# Patient Record
Sex: Male | Born: 1955 | Race: White | Hispanic: No | Marital: Married | Smoking: Never smoker
Health system: Southern US, Community
[De-identification: ages and names within clinical notes are randomized; demographics above are authoritative.]

## PROBLEM LIST (undated history)

## (undated) DIAGNOSIS — D649 Anemia, unspecified: Secondary | ICD-10-CM

## (undated) DIAGNOSIS — I499 Cardiac arrhythmia, unspecified: Secondary | ICD-10-CM

## (undated) DIAGNOSIS — F32A Depression, unspecified: Secondary | ICD-10-CM

## (undated) DIAGNOSIS — N289 Disorder of kidney and ureter, unspecified: Secondary | ICD-10-CM

## (undated) DIAGNOSIS — R011 Cardiac murmur, unspecified: Secondary | ICD-10-CM

## (undated) DIAGNOSIS — R569 Unspecified convulsions: Secondary | ICD-10-CM

## (undated) DIAGNOSIS — F329 Major depressive disorder, single episode, unspecified: Secondary | ICD-10-CM

## (undated) DIAGNOSIS — M5126 Other intervertebral disc displacement, lumbar region: Secondary | ICD-10-CM

## (undated) HISTORY — DX: Cardiac murmur, unspecified: R01.1

## (undated) HISTORY — PX: CARDIAC CATHETERIZATION: SHX172

## (undated) HISTORY — DX: Major depressive disorder, single episode, unspecified: F32.9

## (undated) HISTORY — DX: Anemia, unspecified: D64.9

## (undated) HISTORY — DX: Unspecified convulsions: R56.9

## (undated) HISTORY — DX: Cardiac arrhythmia, unspecified: I49.9

## (undated) HISTORY — DX: Depression, unspecified: F32.A

---

## 2004-02-27 ENCOUNTER — Encounter: Admission: RE | Admit: 2004-02-27 | Discharge: 2004-02-27 | Payer: Self-pay | Admitting: Neurology

## 2004-02-27 IMAGING — CR DG CHEST 2V
2 series · 2 of 2 positions shown · non-contrast
Comparison: none

CLINICAL DATA: Unspecified peripheral neuropathy. 

TWO VIEW CHEST

[view not recorded (1 of 2)]
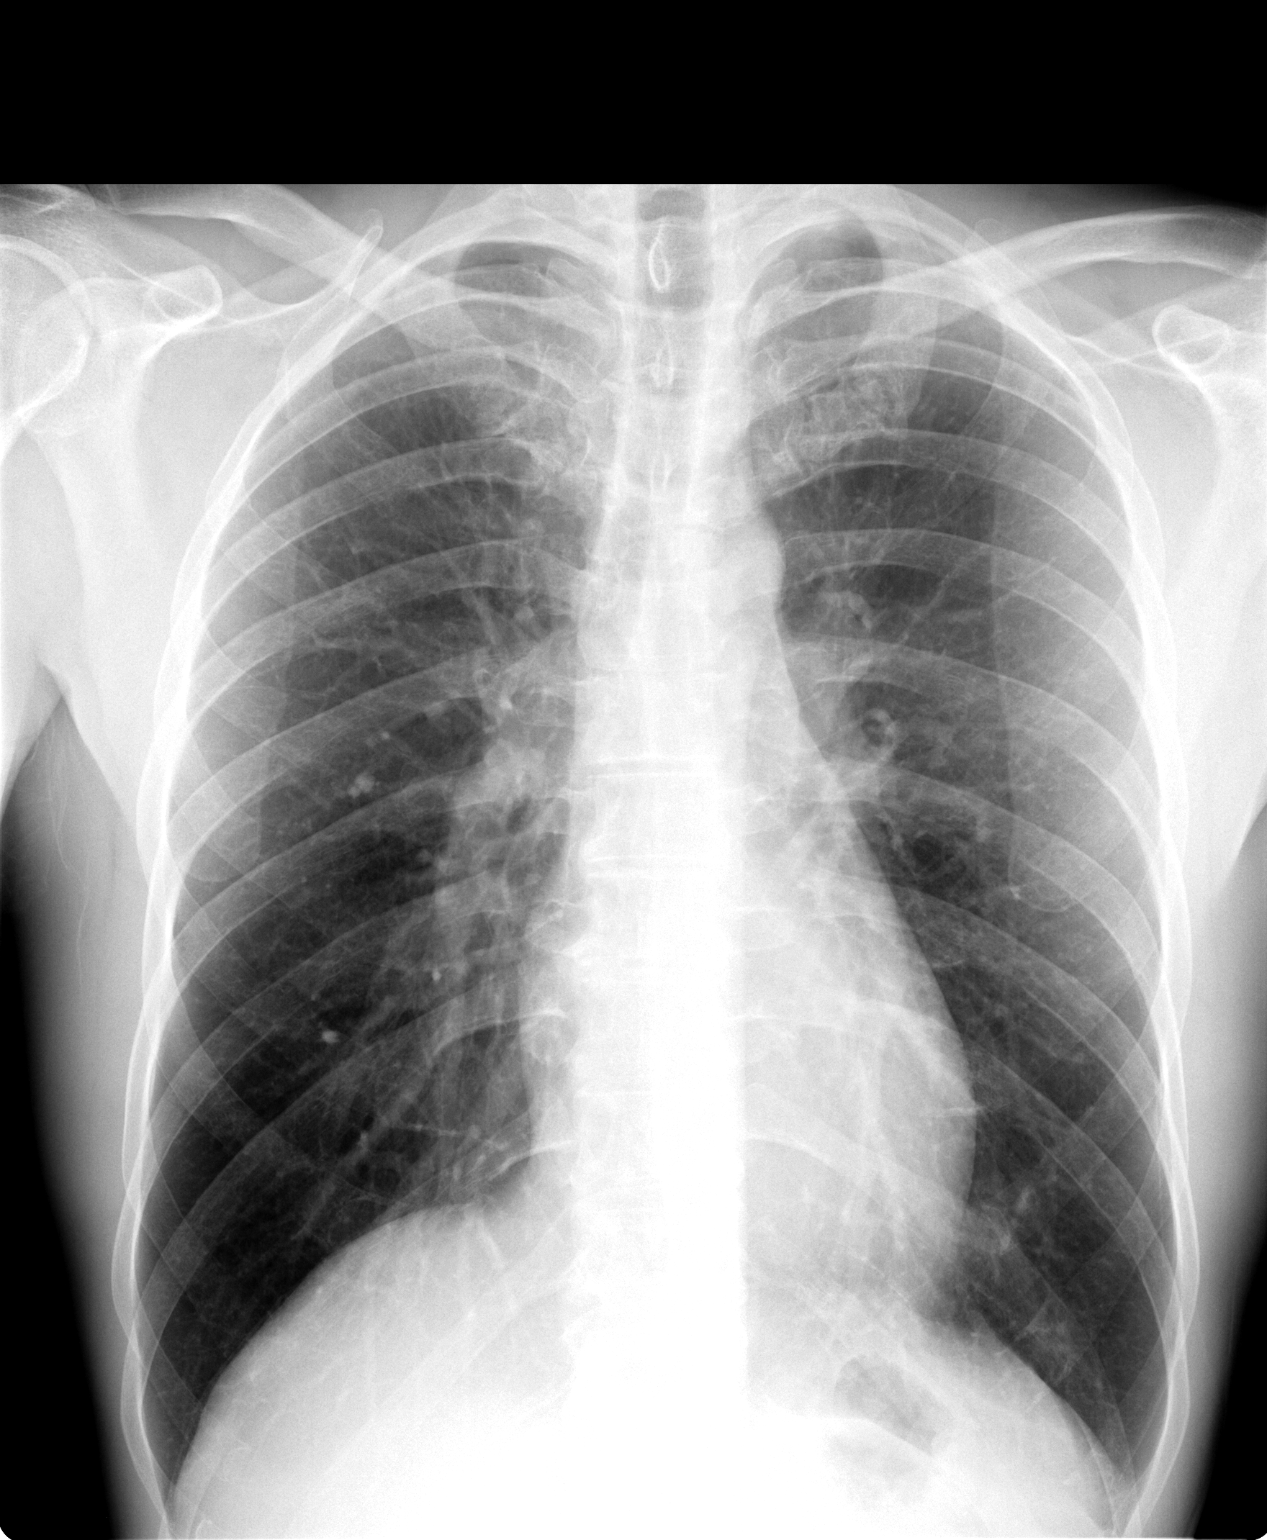

[view not recorded (2 of 2)]
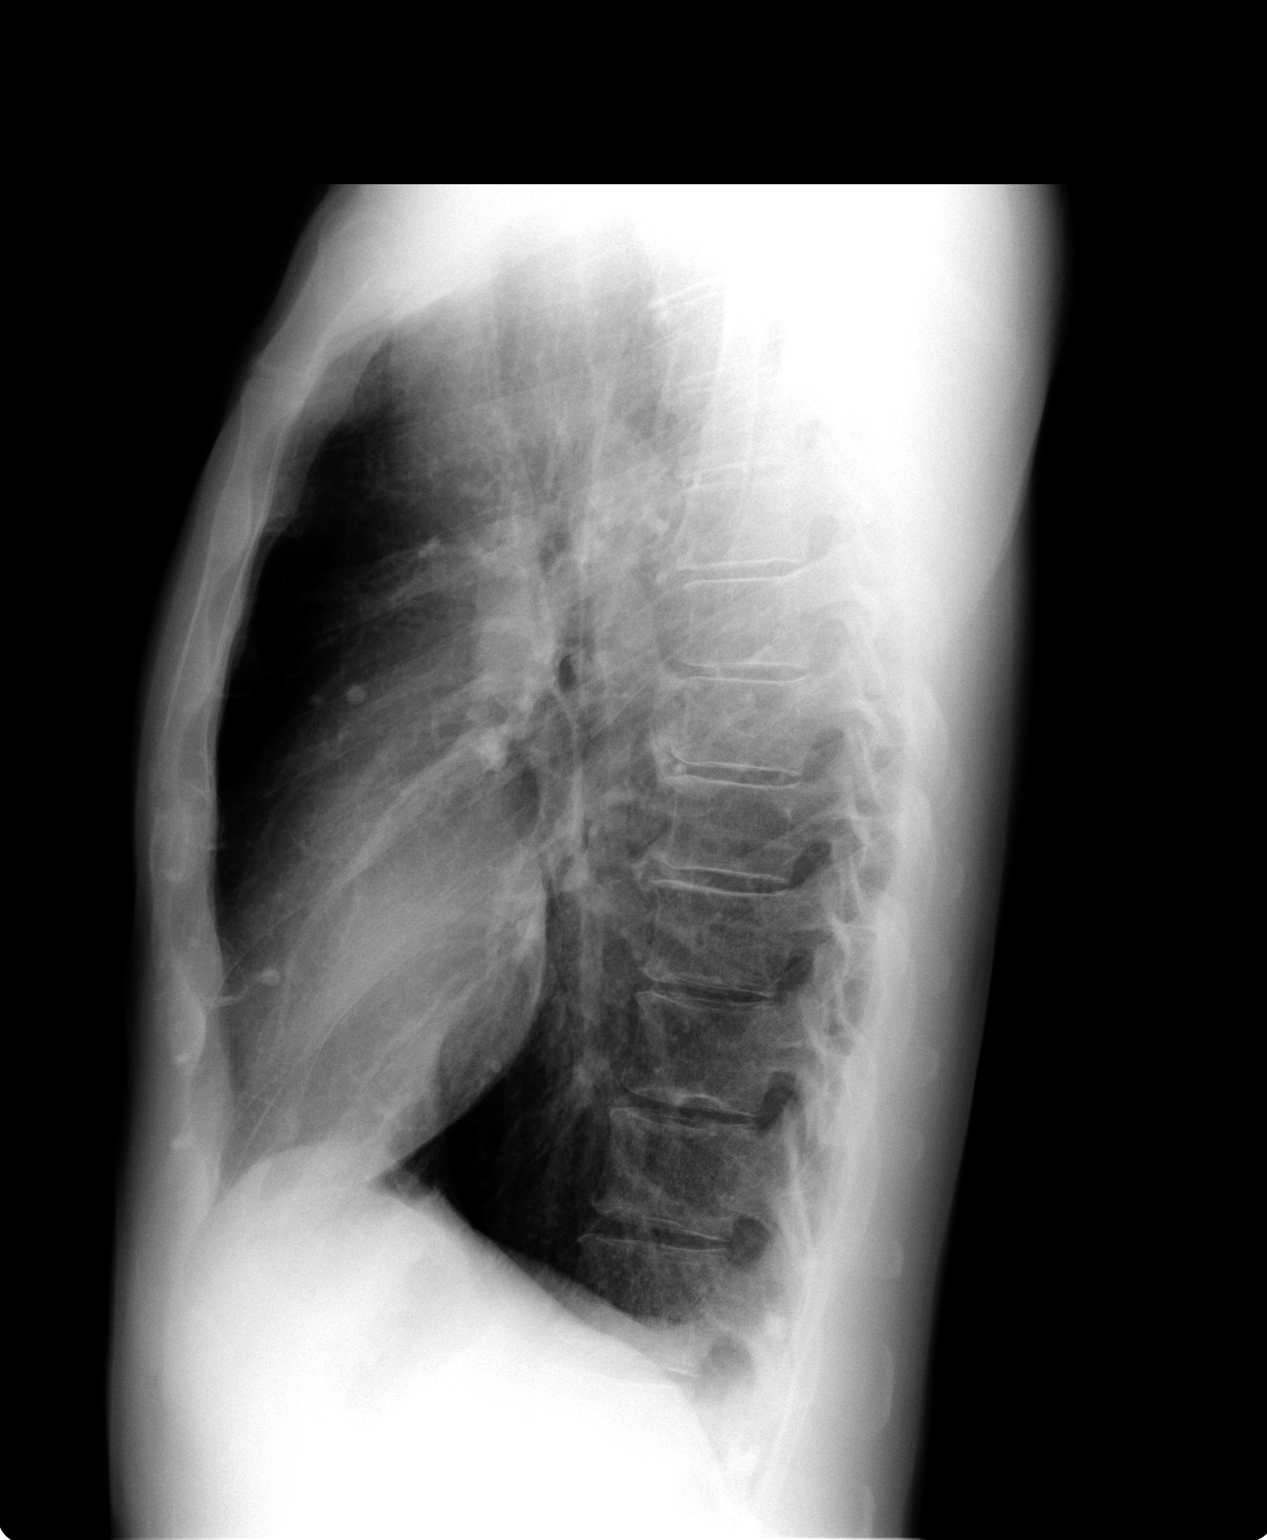

[2 of 2 positions shown; findings below may reference images not displayed]

FINDINGS: The cardiac silhouette, mediastinal and hilar contours are within normal limits. The lungs are
demonstrate hyperinflation. There are small  nodules in both lungs which are probably calcified
granulomas. However,  I would recommend correlation with any prior chest films to make sure these
are stable.  No infiltrates, effusions or edema. Bony structures are intact

IMPRESSION

1. No acute cardiopulmonary findings.
2. Probable small calcified granulomata. Correlation is suggested with any prior films if
available. Otherwise, I would recommend a follow-up two chest in three months to make sure these
are stable and unchanged.
3. Mild hyperinflation.

## 2004-07-07 LAB — HM COLONOSCOPY: HM Colonoscopy: NORMAL

## 2004-07-13 ENCOUNTER — Ambulatory Visit: Payer: Self-pay | Admitting: Internal Medicine

## 2004-08-04 ENCOUNTER — Encounter: Admission: RE | Admit: 2004-08-04 | Discharge: 2004-08-04 | Payer: Self-pay | Admitting: Internal Medicine

## 2004-08-04 IMAGING — CT CT ABDOMEN W/O CM
1 of 4 series · 11 of 32 positions shown, 17 images · non-contrast
Comparison: none

CLINICAL DATA: Mr. DIPALI is a 48-year-old male patient of Dr. DIPALI? here in [HOSPITAL], [facility]HOSPITAL].  Mr. DIPALI has no reported family history of colorectal cancer and no rectal bleeding or abdominal pain.  Virtual colonoscopy was performed today for colorectal cancer screening.
TECHNIQUE: tandard Fleet?s double dose phosphosoda bowel preparation was used for cleansing.  Patient was also given barium and Gastrografin for stool and fluid tagging respectively.  The quality of the colonic preparation is excellent.  The quality of the colonic distention is excellent.   
CT ABDOMEN AND PELVIS WITHOUT CONTRAST (VIRTUAL COLONOSCOPY):
VIRTUAL COLONOSCOPY:
No significant colonic polyps or masses are identified.  There are no significant areas of diverticular change.  No substantial muscular wall hypertrophy is apparent.

[Series 3: recon 2: supine · axial · 0.70mm/px · z∈[-424,-25]mm · 11 of 382 slices shown, 17 images]
[im 32/382  soft-tissue]
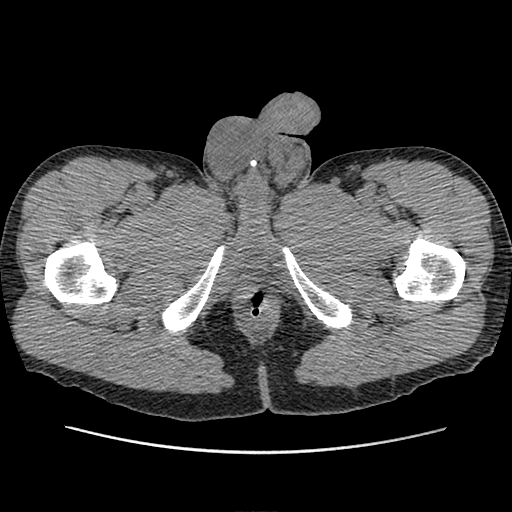
[im 32/382  bone]
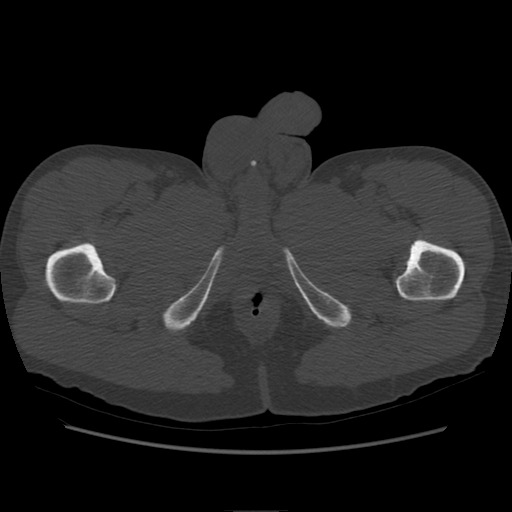
[im 64/382  soft-tissue]
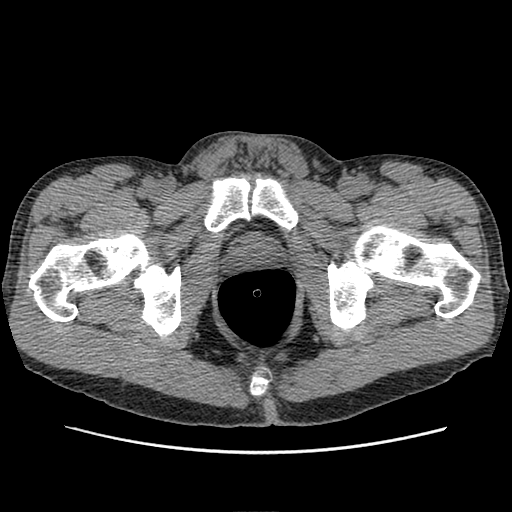
[im 96/382  soft-tissue]
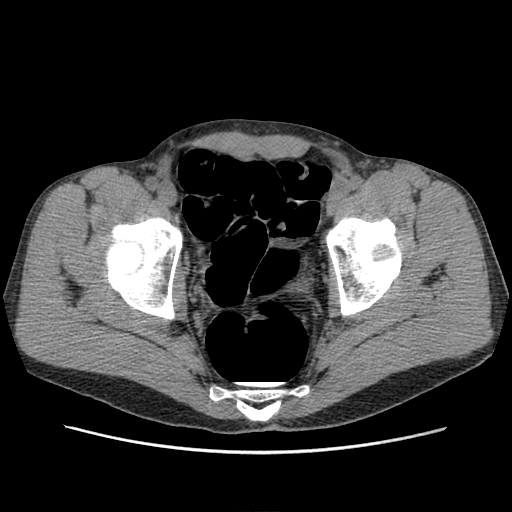
[im 128/382  soft-tissue]
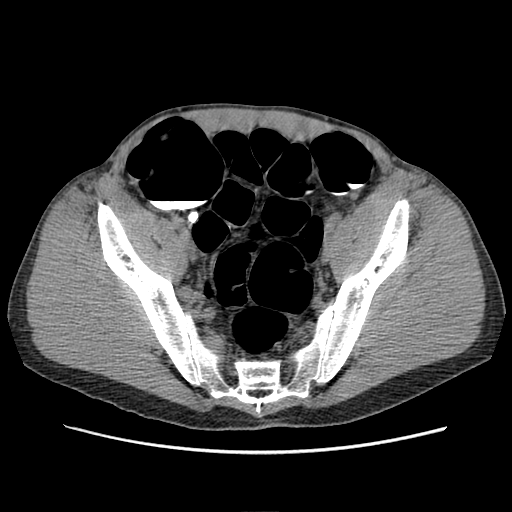
[im 159/382  soft-tissue]
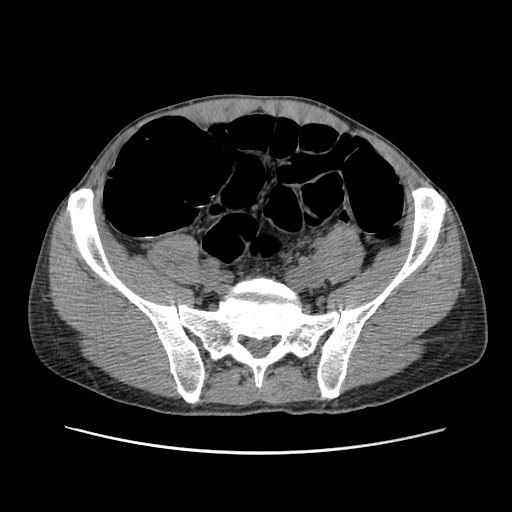
[im 191/382  soft-tissue]
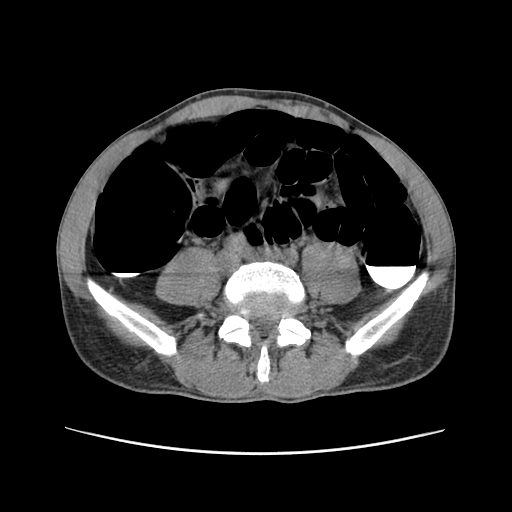
[im 223/382  soft-tissue]
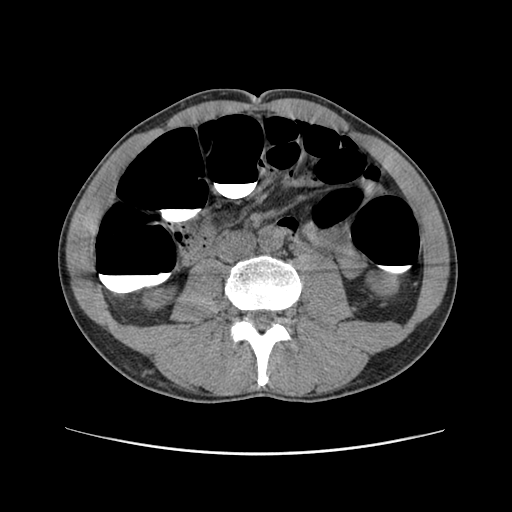
[im 255/382  soft-tissue]
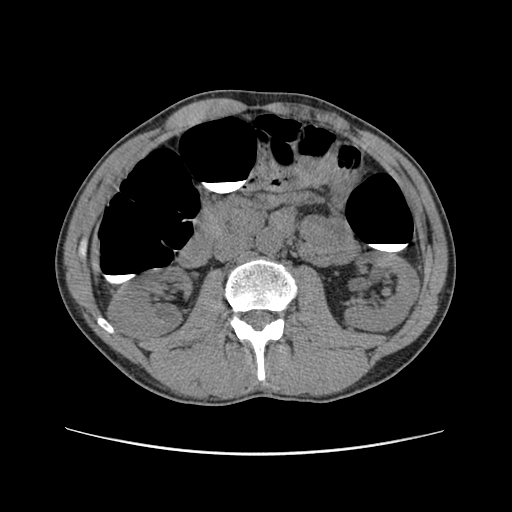
[im 255/382  lung]
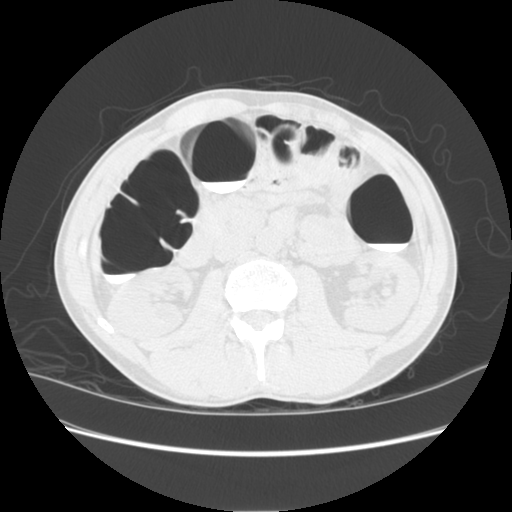
[im 286/382  soft-tissue]
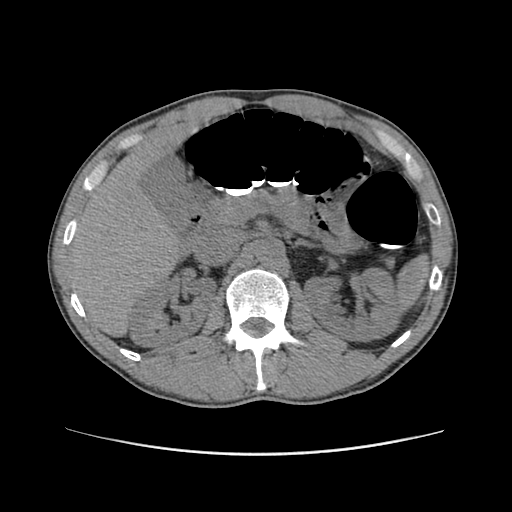
[im 286/382  lung]
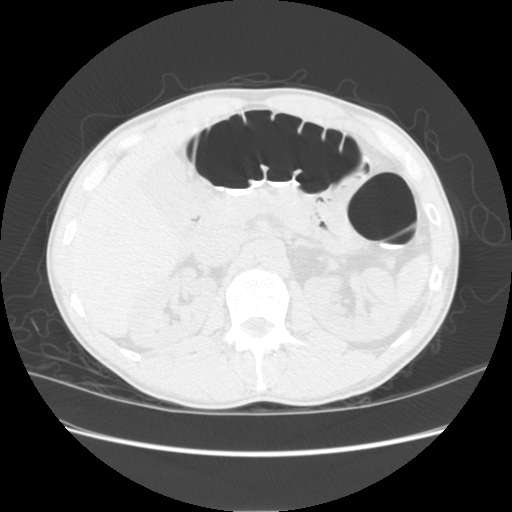
[im 286/382  bone]
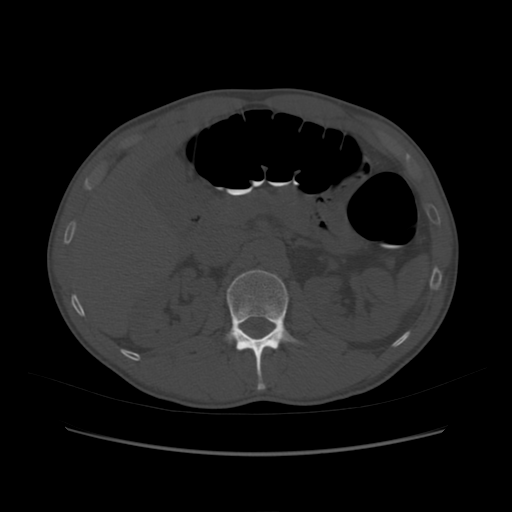
[im 318/382  soft-tissue]
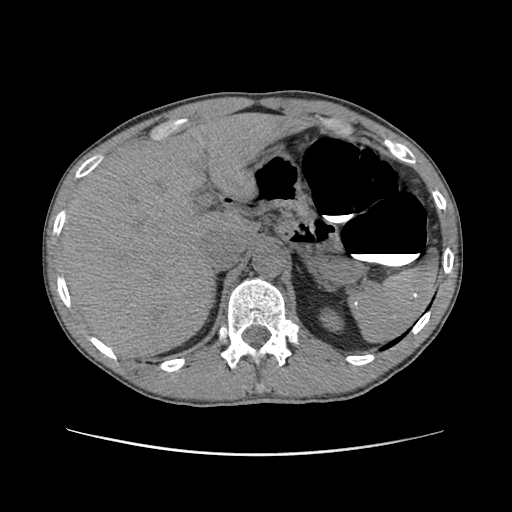
[im 318/382  lung]
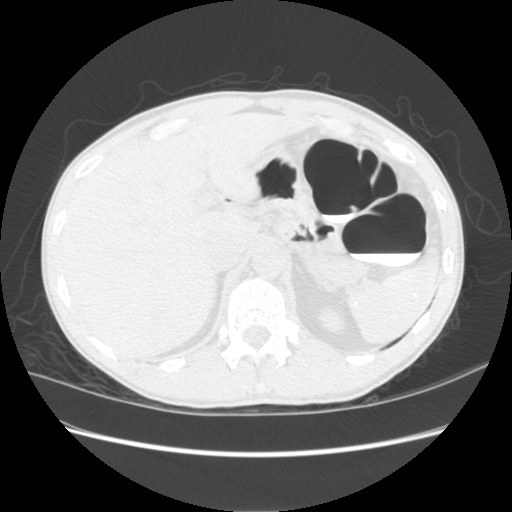
[im 350/382  soft-tissue]
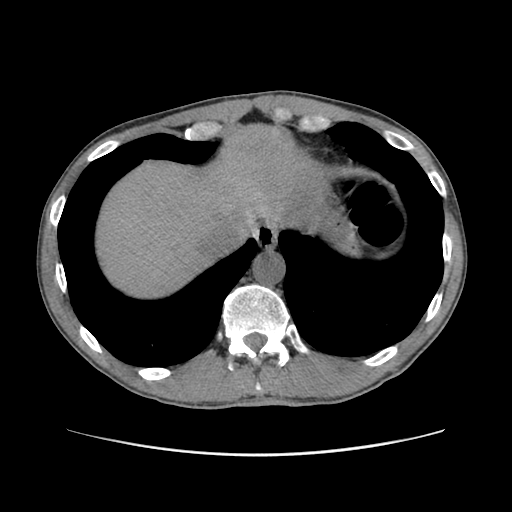
[im 350/382  lung]
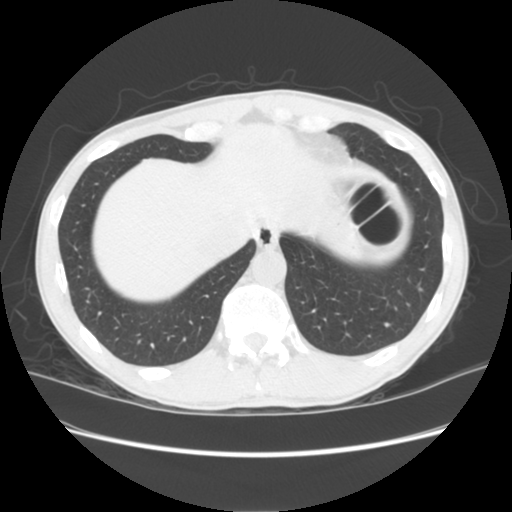

[11 of 32 positions shown; findings below may reference images not displayed]

IMPRESSION: 1.  No clinically significant colonic polyps or masses are identified. 
2.  Virtual colonoscopy is not designed to detect diminutive polyps (i.e., less than or equal to 5 mm), the presence or absence of which may not affect clinical management. 
ABDOMEN CT WITHOUT CONTRAST:
Incidental imaging of the lung bases demonstrates a calcified granuloma in the right lung base.  Calcified granulomatous changes noted in the spleen as well.  Liver and spleen have otherwise normal uninfused appearance.  The stomach, duodenum, pancreas, gallbladder, adrenal glands, and kidneys are unremarkable.  There is no free fluid or lymphadenopathy.  The abdominal aorta is normal without appreciable atherosclerotic calcification.
IMPRESSION: Granulomatous change in the right lung base and spleen.  The exam is otherwise a normal uninfused study.
PELVIS CT WITHOUT CONTRAST:
Imaging through the anatomic pelvis reveals no free fluid or adenopathy.   The prostate gland is not enlarged.  Imaged bony structures are unremarkable.
IMPRESSION: Normal uninfused CT exam of the pelvis.

## 2004-10-11 ENCOUNTER — Ambulatory Visit: Payer: Self-pay | Admitting: Internal Medicine

## 2004-10-26 ENCOUNTER — Ambulatory Visit: Payer: Self-pay | Admitting: Internal Medicine

## 2005-02-28 ENCOUNTER — Ambulatory Visit: Payer: Self-pay | Admitting: Internal Medicine

## 2005-03-02 ENCOUNTER — Ambulatory Visit: Payer: Self-pay

## 2007-02-15 ENCOUNTER — Telehealth: Payer: Self-pay | Admitting: Internal Medicine

## 2007-04-03 ENCOUNTER — Telehealth: Payer: Self-pay | Admitting: Internal Medicine

## 2007-04-04 ENCOUNTER — Ambulatory Visit: Payer: Self-pay | Admitting: Internal Medicine

## 2008-08-25 ENCOUNTER — Encounter: Payer: Self-pay | Admitting: Internal Medicine

## 2008-08-25 LAB — CONVERTED CEMR LAB: PSA: 0.48 ng/mL

## 2008-09-03 ENCOUNTER — Telehealth: Payer: Self-pay | Admitting: Internal Medicine

## 2008-11-13 ENCOUNTER — Telehealth: Payer: Self-pay | Admitting: Internal Medicine

## 2008-11-26 ENCOUNTER — Ambulatory Visit: Payer: Self-pay | Admitting: Internal Medicine

## 2010-11-11 ENCOUNTER — Encounter: Payer: Self-pay | Admitting: Internal Medicine

## 2010-11-15 ENCOUNTER — Other Ambulatory Visit: Payer: Self-pay | Admitting: *Deleted

## 2010-11-15 ENCOUNTER — Ambulatory Visit (INDEPENDENT_AMBULATORY_CARE_PROVIDER_SITE_OTHER): Payer: Commercial Managed Care - PPO | Admitting: Internal Medicine

## 2010-11-15 ENCOUNTER — Encounter: Payer: Self-pay | Admitting: Internal Medicine

## 2010-11-15 VITALS — BP 126/72 | HR 64 | Temp 98.1°F | Ht 73.0 in | Wt 164.0 lb

## 2010-11-15 DIAGNOSIS — Z0000 Encounter for general adult medical examination without abnormal findings: Secondary | ICD-10-CM

## 2010-11-15 DIAGNOSIS — G4700 Insomnia, unspecified: Secondary | ICD-10-CM

## 2010-11-15 LAB — CBC WITH DIFFERENTIAL/PLATELET
Basophils Absolute: 0 10*3/uL (ref 0.0–0.1)
Basophils Relative: 0.7 % (ref 0.0–3.0)
Eosinophils Relative: 2.2 % (ref 0.0–5.0)
HCT: 40 % (ref 39.0–52.0)
Lymphocytes Relative: 28.7 % (ref 12.0–46.0)
MCHC: 34.5 g/dL (ref 30.0–36.0)
Monocytes Relative: 12.2 % — ABNORMAL HIGH (ref 3.0–12.0)
Neutro Abs: 2.8 10*3/uL (ref 1.4–7.7)
Platelets: 182 10*3/uL (ref 150.0–400.0)
RBC: 4.13 Mil/uL — ABNORMAL LOW (ref 4.22–5.81)

## 2010-11-15 LAB — TSH: TSH: 1.95 u[IU]/mL (ref 0.35–5.50)

## 2010-11-15 LAB — BASIC METABOLIC PANEL
CO2: 30 mEq/L (ref 19–32)
Calcium: 9 mg/dL (ref 8.4–10.5)
Creatinine, Ser: 1 mg/dL (ref 0.4–1.5)
Glucose, Bld: 87 mg/dL (ref 70–99)
Potassium: 3.8 mEq/L (ref 3.5–5.1)
Sodium: 141 mEq/L (ref 135–145)

## 2010-11-15 LAB — POCT URINALYSIS DIPSTICK
Blood, UA: NEGATIVE
Glucose, UA: NEGATIVE
Nitrite, UA: NEGATIVE
pH, UA: 5.5

## 2010-11-15 LAB — HEPATIC FUNCTION PANEL
AST: 22 U/L (ref 0–37)
Albumin: 4.8 g/dL (ref 3.5–5.2)
Bilirubin, Direct: 0.2 mg/dL (ref 0.0–0.3)
Total Protein: 7.1 g/dL (ref 6.0–8.3)

## 2010-11-15 LAB — LIPID PANEL: Triglycerides: 80 mg/dL (ref 0.0–149.0)

## 2010-11-15 MED ORDER — EPINEPHRINE 0.3 MG/0.3ML IJ DEVI: 0.3000 mg | Freq: Once | INTRAMUSCULAR | Status: DC

## 2010-11-15 MED ORDER — ALPRAZOLAM 0.5 MG PO TABS: 0.5000 mg | ORAL_TABLET | Freq: Every evening | ORAL | Status: DC | PRN

## 2010-11-15 MED ORDER — EPINEPHRINE 0.15 MG/0.3ML IJ DEVI: 0.1500 mg | INTRAMUSCULAR | Status: DC | PRN

## 2010-11-15 NOTE — Progress Notes (Signed)
  Subjective:    Patient ID: Clayton Mendoza, male    DOB: [birth year], 55 y.o.   MRN: [id number]  HPI  cpx  Recurrent left sided low back pain---increase with certain movements.  Past Medical History  Diagnosis Date  . Heart murmur   . Arrhythmia   . Migraine   . Anemia   . Depression   . Seizures    Past Surgical History  Procedure Date  . Cardiac catheterization     reports that he has never smoked. He does not have any smokeless tobacco history on file. He reports that he drinks alcohol. He reports that he does not use illicit drugs. family history includes Atrial fibrillation in his mother; Cancer in his father; Dementia in his father; and Heart disease in his mother. No Known Allergies    Review of Systems patient denies chest pain, shortness of breath, orthopnea. Denies lower extremity edema, abdominal pain, change in appetite, change in bowel movements. Patient denies rashes, musculoskeletal complaints. No other specific complaints in a complete review of systems.      Objective:   Physical Exam Well-developed male in no acute distress. HEENT exam atraumatic, normocephalic, extraocular muscles are intact. Conjunctivae are pink without exudate. Neck is supple without lymphadenopathy, thyromegaly, jugular venous distention. Chest is clear to auscultation without increased work of breathing. Cardiac exam S1-S2 are regular. The PMI is normal. No significant murmurs or gallops. Abdominal exam active bowel sounds, soft, nontender. No abdominal bruits. Extremities no clubbing cyanosis or edema. Peripheral pulses are normal without bruits. Neurologic exam alert and oriented without any motor or sensory deficits. Rectal exam normal tone prostate normal size without masses or asymmetry.      Assessment & Plan:  Well Visit- Health maint utd  i suspect intermittent back pain will be self limited. No evaluation necessary

## 2011-11-16 ENCOUNTER — Other Ambulatory Visit: Payer: Self-pay | Admitting: Internal Medicine

## 2011-11-17 ENCOUNTER — Telehealth: Payer: Self-pay | Admitting: Internal Medicine

## 2011-11-17 NOTE — Telephone Encounter (Signed)
Pt needs office visit

## 2011-11-17 NOTE — Telephone Encounter (Signed)
Pt called req to get refill of ALPRAZolam (XANAX) 0.5 MG tablet to [facility] at [address]. Pt leaving to go out of town early tomorrow morning and needs to pick up med today. Pt said that the pharmacy sent a req over for this yesterday. Pls call in today.

## 2011-11-29 ENCOUNTER — Ambulatory Visit (INDEPENDENT_AMBULATORY_CARE_PROVIDER_SITE_OTHER): Payer: Commercial Managed Care - PPO | Admitting: Internal Medicine

## 2011-11-29 ENCOUNTER — Encounter: Payer: Self-pay | Admitting: Internal Medicine

## 2011-11-29 VITALS — BP 132/72 | HR 80 | Temp 98.2°F | Ht 72.0 in | Wt 170.0 lb

## 2011-11-29 DIAGNOSIS — F411 Generalized anxiety disorder: Secondary | ICD-10-CM

## 2011-11-29 DIAGNOSIS — T7840XA Allergy, unspecified, initial encounter: Secondary | ICD-10-CM

## 2011-11-29 DIAGNOSIS — Z91030 Bee allergy status: Secondary | ICD-10-CM

## 2011-11-29 DIAGNOSIS — N529 Male erectile dysfunction, unspecified: Secondary | ICD-10-CM

## 2011-11-29 DIAGNOSIS — F419 Anxiety disorder, unspecified: Secondary | ICD-10-CM

## 2011-11-29 MED ORDER — SILDENAFIL CITRATE 100 MG PO TABS: 50.0000 mg | ORAL_TABLET | Freq: Every day | ORAL | Status: DC | PRN

## 2011-11-29 MED ORDER — EPINEPHRINE 0.3 MG/0.3ML IJ DEVI: 0.3000 mg | Freq: Once | INTRAMUSCULAR | Status: DC

## 2011-11-29 MED ORDER — ALPRAZOLAM 0.5 MG PO TABS: 0.5000 mg | ORAL_TABLET | ORAL | Status: DC | PRN

## 2011-11-29 NOTE — Progress Notes (Signed)
Patient ID: Clayton Mendoza, male   DOB: 1956/05/10, 56 y.o.   MRN: [id number] Situational anxiety- xanax works well  ED-- requests viagra-- he used previously with success  Requests refill of Epi pen  Past Medical History  Diagnosis Date  . Heart murmur   . Arrhythmia   . Migraine   . Anemia   . Depression   . Seizures     History   Social History  . Marital Status: Divorced    Spouse Name: N/A    Number of Children: N/A  . Years of Education: N/A   Occupational History  . Not on file.   Social History Main Topics  . Smoking status: Never Smoker   . Smokeless tobacco: Not on file  . Alcohol Use: Yes  . Drug Use: No  . Sexually Active:    Other Topics Concern  . Not on file   Social History Narrative  . No narrative on file    Past Surgical History  Procedure Date  . Cardiac catheterization     Family History  Problem Relation Age of Onset  . Atrial fibrillation Mother   . Heart disease Mother   . Cancer Father     prostate  . Dementia Father     No Known Allergies  Current Outpatient Prescriptions on File Prior to Visit  Medication Sig Dispense Refill  . sildenafil (VIAGRA) 100 MG tablet Take 0.5-1 tablets (50-100 mg total) by mouth daily as needed for erectile dysfunction.  10 tablet  11     patient denies chest pain, shortness of breath, orthopnea. Denies lower extremity edema, abdominal pain, change in appetite, change in bowel movements. Patient denies rashes, musculoskeletal complaints. No other specific complaints in a complete review of systems.   BP 132/72  Pulse 80  Temp 98.2 F (36.8 C) (Oral)  Ht 6' (1.829 m)  Wt 170 lb (77.111 kg)  BMI 23.06 kg/m2  well-developed well-nourished male in no acute distress. HEENT exam atraumatic, normocephalic, neck supple without jugular venous distention. Chest clear to auscultation cardiac exam S1-S2 are regular. Abdominal exam overweight with bowel sounds, soft and nontender. Extremities no edema.  Neurologic exam is alert with a normal gait.   A/P Anxiety- refill xanax Refill epipen ED-- ok to use sildenalfil

## 2013-09-24 ENCOUNTER — Telehealth: Payer: Self-pay | Admitting: Internal Medicine

## 2013-09-24 MED ORDER — ALPRAZOLAM 0.5 MG PO TABS: 0.5000 mg | ORAL_TABLET | Freq: Every day | ORAL | Status: DC | PRN

## 2013-09-24 NOTE — Telephone Encounter (Signed)
Ok per Dr Swords, rx called in 

## 2013-09-24 NOTE — Telephone Encounter (Signed)
Pt needs new rx alprazolam 0.5mg  # 30 only. Pt takes for flight anxiety. [facility] [address]

## 2015-08-13 ENCOUNTER — Encounter: Payer: Self-pay | Admitting: Family Medicine

## 2015-08-13 ENCOUNTER — Ambulatory Visit (INDEPENDENT_AMBULATORY_CARE_PROVIDER_SITE_OTHER): Payer: Commercial Managed Care - PPO | Admitting: Family Medicine

## 2015-08-13 VITALS — BP 140/80 | HR 74 | Temp 98.5°F | Ht 72.0 in | Wt 173.0 lb

## 2015-08-13 DIAGNOSIS — Z23 Encounter for immunization: Secondary | ICD-10-CM | POA: Diagnosis not present

## 2015-08-13 DIAGNOSIS — F418 Other specified anxiety disorders: Secondary | ICD-10-CM

## 2015-08-13 DIAGNOSIS — R7989 Other specified abnormal findings of blood chemistry: Secondary | ICD-10-CM | POA: Diagnosis not present

## 2015-08-13 DIAGNOSIS — B351 Tinea unguium: Secondary | ICD-10-CM | POA: Diagnosis not present

## 2015-08-13 DIAGNOSIS — Z0000 Encounter for general adult medical examination without abnormal findings: Secondary | ICD-10-CM | POA: Diagnosis not present

## 2015-08-13 DIAGNOSIS — B359 Dermatophytosis, unspecified: Secondary | ICD-10-CM

## 2015-08-13 DIAGNOSIS — K219 Gastro-esophageal reflux disease without esophagitis: Secondary | ICD-10-CM | POA: Diagnosis not present

## 2015-08-13 MED ORDER — CLOTRIMAZOLE 1 % EX CREA: 1.0000 "application " | TOPICAL_CREAM | Freq: Two times a day (BID) | CUTANEOUS | Status: DC

## 2015-08-13 MED ORDER — ALPRAZOLAM 0.5 MG PO TABS: 0.5000 mg | ORAL_TABLET | Freq: Every day | ORAL | Status: DC | PRN

## 2015-08-13 NOTE — Progress Notes (Signed)
Subjective:    Patient ID: Clayton Mendoza, male    DOB: [birth year], 60 y.o.   MRN: [id number]  HPI  Clayton Mendoza is a 60 year old male who presents today to establish care. He states that he is in "good health" but needs to establish for preventive care services. Personal, family, and social history updated.  Immunizations: Need for Tdap, no influenza vaccine this season Diet:Modified heart healthy diet with limited caffeine intake. Drinks water and tea Exercise:Runner in the past, currently walks but plans to return to running. Colonoscopy: 2006, patient is due now. Vision: Annual examination by opthamologist, patient wears contacts Dentist: Biannual visits for preventive dental cleanings.  Current problems:  1.  GERD:  Patient states that he has "heartburn" between 3-4 times a week which can occur in the evening and appears to be related to food. He denies significant dyspepsia, hoarseness, dysphagia, or extreme weight loss, melena, or rectal bleeding.   2.  Onychomycosis: Left 4th and 5th toenails are noted to have yellow, thickening on the distal, lateral, subungal portion. He does not have a history of diabetes or cellulitis and this is not painful for him.   3.  Tinea: Patient reports a red, pruritic rash that occurs on his inner thigh of his right leg periodically. He has been treating this with OTC antifungal cream that improves the rash but it will reappear. The rash is noted by patient to be approximately the size of a quarter. He denies any other rashes   4.  Anxiety with travel: Previous provider Rx Xanax for international flights. Current RX that is 62 years old has 3 tablets left in the bottle.  Review of Systems  Constitutional: Negative for fever, chills and fatigue.  HENT: Negative.   Eyes: Negative for pain, itching and visual disturbance.  Respiratory: Negative for cough, chest tightness and shortness of breath.   Cardiovascular: Negative for chest pain, palpitations and  leg swelling.  Gastrointestinal: Negative for nausea, vomiting, abdominal pain, diarrhea, constipation and blood in stool.       Reflux symptoms that occur between 3-4 times/week  Genitourinary: Negative for dysuria, urgency, frequency, hematuria, flank pain and discharge.  Musculoskeletal: Negative for myalgias, arthralgias and neck pain.  Skin: Negative for pallor and rash.  Allergic/Immunologic: Positive for environmental allergies.  Neurological: Negative for dizziness, seizures, speech difficulty, numbness and headaches.  Psychiatric/Behavioral:       Denies depression and anxiety at this time. 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 His current RX is 60 years old and 3 tablets are still remaining in the bottle. He reports SAD however denies any symptoms of depression at this time. No suicidal ideation or plan noted   Past Medical History  Diagnosis Date  . Heart murmur   . Arrhythmia   . Migraine   . Anemia   . Depression   . Seizures [facility])     Social History   Social History  . Marital Status: Divorced    Spouse Name: N/A  . Number of Children: 3  . Years of Education: N/A   Occupational History  . Attorney    Social History Main Topics  . Smoking status: Never Smoker   . Smokeless tobacco: Not on file  . Alcohol Use: 3.0 oz/week    5 Standard drinks or equivalent per week  . Drug Use: No  . Sexual Activity: Not on file  Other Topics Concern  . 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 Cardiac catheterization      Family History  Problem Relation Age of Onset  . Atrial fibrillation Mother   . Heart disease Mother   . Cancer Father     prostate  . Dementia Father     No Known Allergies  Current Outpatient Prescriptions on File Prior to Visit  Medication  Sig Dispense Refill  . ALPRAZolam (XANAX) 0.5 MG tablet Take 1 tablet (0.5 mg total) by mouth daily as needed. For flying 30 tablet 0  . EPINEPHrine (EPIPEN) 0.3 mg/0.3 mL DEVI Inject 0.3 mLs (0.3 mg total) into the muscle once. 1 Device 5  . sildenafil (VIAGRA) 100 MG tablet Take 0.5-1 tablets (50-100 mg total) by mouth daily as needed for erectile dysfunction. 10 tablet 11   No current facility-administered medications on file prior to visit.    BP 140/80 mmHg  Pulse 74  Temp(Src) 98.5 F (36.9 C) (Oral)  Ht 6' (1.829 m)  Wt 173 lb (78.472 kg)  BMI 23.46 kg/m2      Objective:   Physical Exam  Physical Exam  Constitutional: She is oriented to person, place, and time. She appears well-developed and well-nourished. No distress.  HENT:   Head: Normocephalic and atraumatic.  Right Ear: External ear normal.  Left Ear: External ear normal.   Mouth/Throat: Oropharynx is clear and moist.  Eyes: Conjunctivae and EOM are normal. Pupils are equal, round, and reactive to light. No scleral icterus.  Neck: Neck supple. No thyromegaly present.  Cardiovascular: Normal rate, regular rhythm, normal heart sounds and intact distal pulses.    No murmur heard. Pulmonary/Chest: Effort normal and breath sounds normal. She has no wheezes.  Abdominal: Soft. Bowel sounds are normal. She exhibits no mass. There is no rebound.  Musculoskeletal: She exhibits no edema.  Neurological: She is alert and oriented to person, place, and time. No cranial nerve deficit.  Skin: Skin is warm and dry.  Psychiatric: She has a normal mood and affect. Her behavior is normal.      Assessment & Plan:  1. Encounter for preventive health examination  - CBC with Differential/Platelet - Basic metabolic panel - Hepatic function panel - TSH - Hemoglobin A1c - Lipid panel - PSA  2. Need for diphtheria-tetanus-pertussis (Tdap) vaccine   3. Gastroesophageal reflux disease without esophagitis Provided information  for reflux and triggers to avoid. Advised patient to monitor symptoms with avoidance of triggers. If symptoms continue, treatment will be initiated. Patient preferred to avoid trigger such as late night snacks, caffeine, and alcohol and monitor symptoms.  4. Onychomycosis due to dermatophyte Discussed with patient that treatment at this point would be for cosmetic reasons. Also discussed length of time for treatment and risks associated. He chose to defer treatment and monitor nails at this time. Discussed the lack of evidence for use of topical agents at this time.  5. Tinea Clotrimazole cream provided for tinea rash on inner thigh. Instructed patient regarding use and s/sx to report.   6. Need for Tdap vaccination - Tdap vaccine greater than or equal to 7yo IM  7. Needs flu shot - Flu Vaccine QUAD 36+ mos PF IM (Fluarix & Fluzone Quad PF)   8. Situational anxiety  Rx for alprazolam 0.5mg  prn for flying was provided.  Patient brought previous Rx with him that was 60 years old and had 3 tablets remaining.  Referral for colonoscopy placed today.

## 2015-08-13 NOTE — Progress Notes (Signed)
Pre visit review using our clinic review tool, if applicable. No additional management support is needed unless otherwise documented below in the visit note. 

## 2015-08-13 NOTE — Patient Instructions (Signed)
Please go to lab before you leave and obtain blood work. Results will be called to you within one week or sooner if needed. Also, avoid triggers for reflux and if symptoms persist please contact me for further information for medication. Alprazolam for anxiety due to flying. Please avoid this medication when driving and use sparingly.  It was a pleasure meeting you today!   Gastroesophageal Reflux Disease, Adult Normally, food travels down the esophagus and stays in the stomach to be digested. However, when a person has gastroesophageal reflux disease (GERD), food and stomach acid move back up into the esophagus. When this happens, the esophagus becomes sore and inflamed. Over time, GERD can create small holes (ulcers) in the lining of the esophagus.  CAUSES This condition is caused by a problem with the muscle between the esophagus and the stomach (lower esophageal sphincter, or LES). Normally, the LES muscle closes after food passes through the esophagus to the stomach. When the LES is weakened or abnormal, it does not close properly, and that allows food and stomach acid to go back up into the esophagus. The LES can be weakened by certain dietary substances, medicines, and medical conditions, including:  Tobacco use.  Pregnancy.  Having a hiatal hernia.  Heavy alcohol use.  Certain foods and beverages, such as coffee, chocolate, onions, and peppermint. RISK FACTORS This condition is more likely to develop in:  People who have an increased body weight.  People who have connective tissue disorders.  People who use NSAID medicines. SYMPTOMS Symptoms of this condition include:  Heartburn.  Difficult or painful swallowing.  The feeling of having a lump in the throat.  Abitter taste in the mouth.  Bad breath.  Having a large amount of saliva.  Having an upset or bloated stomach.  Belching.  Chest pain.  Shortness of breath or wheezing.  Ongoing (chronic) cough or a  night-time cough.  Wearing away of tooth enamel.  Weight loss. Different conditions can cause chest pain. Make sure to see your health care provider if you experience chest pain. DIAGNOSIS Your health care provider will take a medical history and perform a physical exam. To determine if you have mild or severe GERD, your health care provider may also monitor how you respond to treatment. You may also have other tests, including:  An endoscopy toexamine your stomach and esophagus with a small camera.  A test thatmeasures the acidity level in your esophagus.  A test thatmeasures how much pressure is on your esophagus.  A barium swallow or modified barium swallow to show the shape, size, and functioning of your esophagus. TREATMENT The goal of treatment is to help relieve your symptoms and to prevent complications. Treatment for this condition may vary depending on how severe your symptoms are. Your health care provider may recommend:  Changes to your diet.  Medicine.  Surgery. HOME CARE INSTRUCTIONS Diet  Follow a diet as recommended by your health care provider. This may involve avoiding foods and drinks such as:  Coffee and tea (with or without caffeine).  Drinks that containalcohol.  Energy drinks and sports drinks.  Carbonated drinks or sodas.  Chocolate and cocoa.  Peppermint and mint flavorings.  Garlic and onions.  Horseradish.  Spicy and acidic foods, including peppers, chili powder, curry powder, vinegar, hot sauces, and barbecue sauce.  Citrus fruit juices and citrus fruits, such as oranges, lemons, and limes.  Tomato-based foods, such as red sauce, chili, salsa, and pizza with red sauce.  [place] and  fatty foods, such as donuts, french fries, potato chips, and high-fat dressings.  High-fat meats, such as hot dogs and fatty cuts of red and white meats, such as rib eye steak, sausage, ham, and bacon.  High-fat dairy items, such as whole milk, butter,  and cream cheese.  Eat small, frequent meals instead of large meals.  Avoid drinking large amounts of liquid with your meals.  Avoid eating meals during the 2-3 hours before bedtime.  Avoid lying down right after you eat.  Do not exercise right after you eat. General Instructions  Pay attention to any changes in your symptoms.  Take over-the-counter and prescription medicines only as told by your health care provider. Do not take aspirin, ibuprofen, or other NSAIDs unless your health care provider told you to do so.  Do not use any tobacco products, including cigarettes, chewing tobacco, and e-cigarettes. If you need help quitting, ask your health care provider.  Wear loose-fitting clothing. Do not wear anything tight around your waist that causes pressure on your abdomen.  Raise (elevate) the head of your bed 6 inches (15cm).  Try to reduce your stress, such as with yoga or meditation. If you need help reducing stress, ask your health care provider.  If you are overweight, reduce your weight to an amount that is healthy for you. Ask your health care provider for guidance about a safe weight loss goal.  Keep all follow-up visits as told by your health care provider. This is important. SEEK MEDICAL CARE IF:  You have new symptoms.  You have unexplained weight loss.  You have difficulty swallowing, or it hurts to swallow.  You have wheezing or a persistent cough.  Your symptoms do not improve with treatment.  You have a hoarse voice. SEEK IMMEDIATE MEDICAL CARE IF:  You have pain in your arms, neck, jaw, teeth, or back.  You feel sweaty, dizzy, or light-headed.  You have chest pain or shortness of breath.  You vomit and your vomit looks like blood or coffee grounds.  You faint.  Your stool is bloody or black.  You cannot swallow, drink, or eat.   This information is not intended to replace advice given to you by your health care provider. Make sure you discuss  any questions you have with your health care provider.   Document Released: 03/02/2005 Document Revised: 02/11/2015 Document Reviewed: 09/17/2014 Elsevier Interactive Patient Education [employer]2016 [employer] Inc.

## 2015-08-14 LAB — BASIC METABOLIC PANEL
BUN: 26 mg/dL — ABNORMAL HIGH (ref 6–23)
CALCIUM: 9.7 mg/dL (ref 8.4–10.5)
CO2: 32 meq/L (ref 19–32)
Chloride: 104 mEq/L (ref 96–112)
Creatinine, Ser: 1.04 mg/dL (ref 0.40–1.50)
GFR: 76.93 mL/min (ref >60.00)
Glucose, Bld: 99 mg/dL (ref 70–99)
Potassium: 4.5 mEq/L (ref 3.5–5.1)
SODIUM: 142 meq/L (ref 135–145)

## 2015-08-14 LAB — HEPATIC FUNCTION PANEL
ALK PHOS: 55 U/L (ref 39–117)
ALT: 17 U/L (ref 0–53)
AST: 19 U/L (ref 0–37)
Albumin: 4.6 g/dL (ref 3.5–5.2)
BILIRUBIN DIRECT: 0.1 mg/dL (ref 0.0–0.3)
TOTAL PROTEIN: 6.9 g/dL (ref 6.0–8.3)
Total Bilirubin: 0.6 mg/dL (ref 0.2–1.2)

## 2015-08-14 LAB — CBC WITH DIFFERENTIAL/PLATELET
Basophils Absolute: 0 10*3/uL (ref 0.0–0.1)
Basophils Relative: 0.7 % (ref 0.0–3.0)
EOS ABS: 0.2 10*3/uL (ref 0.0–0.7)
EOS PCT: 3 % (ref 0.0–5.0)
HCT: 42.3 % (ref 39.0–52.0)
Hemoglobin: 14.3 g/dL (ref 13.0–17.0)
LYMPHS ABS: 1.7 10*3/uL (ref 0.7–4.0)
Lymphocytes Relative: 27.6 % (ref 12.0–46.0)
MCHC: 33.8 g/dL (ref 30.0–36.0)
MCV: 95.5 fl (ref 78.0–100.0)
MONO ABS: 0.6 10*3/uL (ref 0.1–1.0)
Monocytes Relative: 10 % (ref 3.0–12.0)
NEUTROS PCT: 58.7 % (ref 43.0–77.0)
Neutro Abs: 3.6 10*3/uL (ref 1.4–7.7)
Platelets: 202 10*3/uL (ref 150.0–400.0)
RBC: 4.43 Mil/uL (ref 4.22–5.81)
RDW: 13.2 % (ref 11.5–15.5)
WBC: 6.1 10*3/uL (ref 4.0–10.5)

## 2015-08-14 LAB — LIPID PANEL
CHOL/HDL RATIO: 4
CHOLESTEROL: 215 mg/dL — AB (ref 0–200)
HDL: 53.2 mg/dL (ref >39.00)
NonHDL: 161.74
TRIGLYCERIDES: 267 mg/dL — AB (ref 0.0–149.0)
VLDL: 53.4 mg/dL — AB (ref 0.0–40.0)

## 2015-08-14 LAB — LDL CHOLESTEROL, DIRECT: Direct LDL: 125 mg/dL

## 2015-08-14 LAB — TSH: TSH: 1.86 u[IU]/mL (ref 0.35–4.50)

## 2015-08-14 LAB — PSA: PSA: 0.48 ng/mL (ref 0.10–4.00)

## 2015-08-14 LAB — HEMOGLOBIN A1C: Hgb A1c MFr Bld: 5.5 % (ref 4.6–6.5)

## 2015-08-17 ENCOUNTER — Telehealth: Payer: Self-pay | Admitting: Family Medicine

## 2015-08-17 ENCOUNTER — Other Ambulatory Visit: Payer: Self-pay | Admitting: Family Medicine

## 2015-08-17 DIAGNOSIS — E785 Hyperlipidemia, unspecified: Secondary | ICD-10-CM

## 2015-08-17 MED ORDER — ATORVASTATIN CALCIUM 10 MG PO TABS: 10.0000 mg | ORAL_TABLET | Freq: Every day | ORAL | Status: DC

## 2015-08-17 NOTE — Telephone Encounter (Signed)
Cholesterol level is elevated. Triglycerides are 267 and ideally should be <150. Heart healthy diet, exercise, and treatment with atorvastatin. Follow up in 6-8 weeks for lipid level evaluation.

## 2016-06-27 ENCOUNTER — Telehealth: Payer: Self-pay | Admitting: Family Medicine

## 2016-06-27 DIAGNOSIS — H5213 Myopia, bilateral: Secondary | ICD-10-CM | POA: Diagnosis not present

## 2016-06-27 NOTE — Telephone Encounter (Signed)
[facility], Patient is aware.

## 2016-06-27 NOTE — Telephone Encounter (Signed)
° ° ° °  Pt said Dr Kirtland BouchardK had given him the below RX and is asking if he can have a refill Rx [facility]    [facility]  [address]

## 2016-07-22 DIAGNOSIS — R0989 Other specified symptoms and signs involving the circulatory and respiratory systems: Secondary | ICD-10-CM | POA: Diagnosis not present

## 2016-07-22 DIAGNOSIS — R0602 Shortness of breath: Secondary | ICD-10-CM | POA: Diagnosis not present

## 2016-07-22 DIAGNOSIS — J40 Bronchitis, not specified as acute or chronic: Secondary | ICD-10-CM | POA: Diagnosis not present

## 2016-09-23 DIAGNOSIS — H5213 Myopia, bilateral: Secondary | ICD-10-CM | POA: Diagnosis not present

## 2016-09-23 DIAGNOSIS — H52223 Regular astigmatism, bilateral: Secondary | ICD-10-CM | POA: Diagnosis not present

## 2016-09-23 DIAGNOSIS — H524 Presbyopia: Secondary | ICD-10-CM | POA: Diagnosis not present

## 2017-01-10 DIAGNOSIS — R911 Solitary pulmonary nodule: Secondary | ICD-10-CM | POA: Diagnosis not present

## 2017-01-10 DIAGNOSIS — J8410 Pulmonary fibrosis, unspecified: Secondary | ICD-10-CM | POA: Diagnosis not present

## 2017-01-10 DIAGNOSIS — J301 Allergic rhinitis due to pollen: Secondary | ICD-10-CM | POA: Diagnosis not present

## 2017-09-29 DIAGNOSIS — H5213 Myopia, bilateral: Secondary | ICD-10-CM | POA: Diagnosis not present

## 2017-09-29 DIAGNOSIS — H52223 Regular astigmatism, bilateral: Secondary | ICD-10-CM | POA: Diagnosis not present

## 2017-09-29 DIAGNOSIS — H524 Presbyopia: Secondary | ICD-10-CM | POA: Diagnosis not present

## 2017-10-25 ENCOUNTER — Encounter: Payer: Self-pay | Admitting: Family Medicine

## 2017-10-25 ENCOUNTER — Ambulatory Visit: Payer: Commercial Managed Care - PPO | Admitting: Family Medicine

## 2017-10-25 VITALS — BP 120/68 | HR 72 | Temp 98.1°F | Ht 72.0 in | Wt 169.0 lb

## 2017-10-25 DIAGNOSIS — R911 Solitary pulmonary nodule: Secondary | ICD-10-CM

## 2017-10-25 DIAGNOSIS — L989 Disorder of the skin and subcutaneous tissue, unspecified: Secondary | ICD-10-CM

## 2017-10-25 DIAGNOSIS — F40243 Fear of flying: Secondary | ICD-10-CM | POA: Diagnosis not present

## 2017-10-25 DIAGNOSIS — Z91038 Other insect allergy status: Secondary | ICD-10-CM | POA: Diagnosis not present

## 2017-10-25 MED ORDER — EPINEPHRINE 0.3 MG/0.3ML IJ SOAJ: 0.3000 mg | Freq: Once | INTRAMUSCULAR | 2 refills | Status: AC

## 2017-10-25 MED ORDER — HYDROXYZINE HCL 50 MG PO TABS: ORAL_TABLET | ORAL | 0 refills | Status: DC

## 2017-10-25 NOTE — Patient Instructions (Signed)
Actinic Keratosis An actinic keratosis is a precancerous growth on the skin. This means that it could develop into skin cancer if it is not treated. About 1% of these growths (actinic keratoses) turn into skin cancer within one year if they are not treated. It is important to have all of these growths evaluated to determine the best treatment approach. What are the causes? This condition is caused by getting too much ultraviolet (UV) radiation from the sun or other UV light sources. What increases the risk? The following factors may make you more likely to develop this condition:  Having light-colored skin and blue eyes.  Having blonde or red hair.  Spending a lot of time in the sun.  Inadequate skin protection when outdoors. This may include: ? Not using sunscreen properly. ? Not covering up skin that is exposed to sunlight.  Aging. The risk of developing an actinic keratosis increases with age.  What are the signs or symptoms? Actinic keratoses look like scaly, rough spots of skin.They can be as small as a pinhead or as big as a quarter. They may itch, hurt, or feel sensitive. In most cases, the growths become red. In some cases, they may be skin-colored, light tan, dark tan, pink, or a combination of any of these colors. There may be a small piece of pink or gray skin (skin tag) growing from the actinic keratosis. In some cases, it may be easier to notice actinic keratoses by feeling them, rather than seeing them. Actinic keratoses appear most often on areas of skin that get a lot of sun exposure, including the scalp, face, ears, lips, upper back, forearms, and the backs of the hands. Sometimes, actinic keratoses disappear, but many reappear a few days to a few weeks later. How is this diagnosed? This condition is usually diagnosed with a physical exam. A tissue sample may be removed from the actinic keratosis and examined under a microscope (biopsy). How is this treated?  Treatment for  this condition may include:  Scraping off the actinic keratosis (curettage).  Freezing the actinic keratosis with liquid nitrogen (cryosurgery). This causes the growth to eventually fall off the skin.  Applying medicated creams or gels to destroy the cells in the growth.  Applying chemicals to the actinic keratosis to make the outer layers of skin peel off (chemical peel).  Photodynamic therapy. In this procedure, medicated cream is applied to the actinic keratosis. This cream increases your skin's sensitivity to light. Then, a strong light is aimed at the actinic keratosis to destroy cells in the growth.  Follow these instructions at home: Skin care  Apply cool, wet cloths (cool compresses) to the affected areas.  Do not scratch your skin.  Check your skin regularly for any growths, especially growths that: ? Start to itch or bleed. ? Change in size, shape, or color. Caring for the treated area  Keep the treated area clean and dry as told by your health care provider.  Do not apply any medicine, cream, or lotion to the treated area unless your health care provider tells you to do that.  Do not pick at blisters or try to break them open. This can cause infection and scarring.  If you have red or irritated skin after treatment, follow instructions from your health care provider about how to take care of the treated area. Make sure you: ? Wash your hands with soap and water before you change your bandage (dressing). If soap and water are not available, use   hand sanitizer. ? Change your dressing as told by your health care provider.  If you have red or irritated skin after treatment, check your treated area every day for signs of infection. Check for: ? Swelling, pain, or more redness. ? Fluid or blood. ? Warmth. ? Pus or a bad smell. General instructions  Take over-the-counter and prescription medicines only as told by your health care provider.  Return to your normal  activities as told by your health care provider. Ask your health care provider what activities are safe for you.  Do not use any tobacco products, such as cigarettes, chewing tobacco, and e-cigarettes. If you need help quitting, ask your health care provider.  Have a skin exam done every year by a health care provider who is a skin conditions specialist (dermatologist).  Keep all follow-up visits as told by your health care provider. This is important. How is this prevented?  Do not get sunburns.  Try to avoid the sun between 10:00 a.m. and 4:00 p.m. This is when the UV light is the strongest.  Use a sunscreen or sunblock with SPF 30 (sun protection factor 30) or greater.  Apply sunscreen before you are exposed to sunlight, and reapply periodically as often as directed by the instructions on the sunscreen container.  Always wear sunglasses that have UV protection, and always wear hats and clothing to protect your skin from sunlight.  When possible, avoid medicines that increase your sensitivity to sunlight. These include: ? Certain antibiotic medicines. ? Certain water pills (diuretics). ? Certain prescription medicines that are used to treat acne (retinoids).  Do not use tanning beds or other indoor tanning devices. Contact a health care provider if:  You notice any changes or new growths on your skin.  You have swelling, pain, or more redness around your treated area.  You have fluid or blood coming from your treated area.  Your treated area feels warm to the touch.  You have pus or a bad smell coming from your treated area.  You have a fever.  You have a blister that becomes large and painful. This information is not intended to replace advice given to you by your health care provider. Make sure you discuss any questions you have with your health care provider. Document Released: 08/19/2008 Document Revised: 01/22/2016 Document Reviewed: 01/31/2015 Elsevier Interactive  Patient Education  2018 [employer]  

## 2017-10-25 NOTE — Progress Notes (Signed)
Subjective:    Patient ID: Clayton Mendoza, male    DOB: 15-Mar-1956, 62 y.o.   MRN: [id number]  No chief complaint on file.   HPI Patient was seen today for transfer of care.  Previously seen by Roddie Mc, FNP.  Patient states he is doing well overall.  Anxiety with flying: -Patient will occasionally take Xanax 0.5 mg 1 hour prior to flying. -Typically a bottle of 30 pills will last 1 year -Patient endorses having several flights back to back in the last few weeks when traveling to [place] for vacation.  Increased reaction to insect bites/stings: -In the past patient had an EpiPen. -He endorses one episode of feeling tightness in his throat after being stung by yellow jacket. -Would like a refill on EpiPen.  History of lung nodules: -Patient endorses lung nodules noted on chest x-ray several years ago. -He had follow-up x-ray a few months after the initial x-ray which was stable. -The nodules were thought 2/2 calcified granulomas. -Patient has a history of going up in the [facility]. -Patient has never smoked.  Skin lesions: -Patient endorses a mole on his right shoulder that has slowly increased in size over the last 3 years. -The area does not itch or bleed. -Patient also notes a dry scaly area on his forehead. -Patient endorses wearing hats and covering up when out in the sun.  Patient also wears sunscreen.  Past Medical History:  Diagnosis Date  . Anemia   . Arrhythmia   . Depression   . Heart murmur   . Migraine   . Seizures (HCC)     No Known Allergies  ROS General: Denies fever, chills, night sweats, changes in weight, changes in appetite HEENT: Denies headaches, ear pain, changes in vision, rhinorrhea, sore throat CV: Denies CP, palpitations, SOB, orthopnea Pulm: Denies SOB, cough, wheezing  +h/o lung nodules GI: Denies abdominal pain, nausea, vomiting, diarrhea, constipation GU: Denies dysuria, hematuria, frequency, vaginal discharge Msk: Denies  muscle cramps, joint pains Neuro: Denies weakness, numbness, tingling Skin: Denies rashes, bruising  + 2 skin lesions (forehead and R shoulder) Psych: Denies depression, hallucinations  +anxiety with flying     Objective:    Blood pressure 120/68, pulse 72, temperature 98.1 F (36.7 C), temperature source Oral, height 6' (1.829 m), weight 169 lb (76.7 kg), SpO2 97 %.   Gen. Pleasant, well-nourished, in no distress, normal affect   HEENT: [place]/AT, face symmetric, no scleral icterus, PERRLA, nares patent without drainage, pharynx without erythema or exudate.  TMs normal bilaterally.  Some cerumen in canals bilaterally Neck: No JVD, no thyromegaly, no carotid bruits Lungs: no accessory muscle use, CTAB, no wheezes or rales Cardiovascular: RRR, no m/r/g, no peripheral edema Abdomen: BS present, soft, NT/ND Neuro:  A&Ox3, CN II-XII intact, normal gait Skin:  Warm, dry, intact.  Right posterior shoulder with 8 mm nevus, regular borders, slightly raised, consistent color.  3 mm dry, scaly appearing area on forehead to the right of midline slightly below hairline likely an AK   Wt Readings from Last 3 Encounters:  10/25/17 169 lb (76.7 kg)  08/13/15 173 lb (78.5 kg)  11/29/11 170 lb (77.1 kg)    Lab Results  Component Value Date   WBC 6.1 08/13/2015   HGB 14.3 08/13/2015   HCT 42.3 08/13/2015   PLT 202.0 08/13/2015   GLUCOSE 99 08/13/2015   CHOL 215 (H) 08/13/2015   TRIG 267.0 (H) 08/13/2015   HDL 53.20 08/13/2015   LDLDIRECT 125.0 08/13/2015  LDLCALC 126 (H) 11/15/2010   ALT 17 08/13/2015   AST 19 08/13/2015   NA 142 08/13/2015   K 4.5 08/13/2015   CL 104 08/13/2015   CREATININE 1.04 08/13/2015   BUN 26 (H) 08/13/2015   CO2 32 08/13/2015   TSH 1.86 08/13/2015   PSA 0.48 08/13/2015   HGBA1C 5.5 08/13/2015    Assessment/Plan:  Anxiety with flying  -Discussed using hydroxyzine as opposed to Xanax 0.5 mg for anxiety. -Patient is amenable to trying hydroxyzine.  If not  effective will consider refilling Xanax as patient is using infrequently. 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 -Discussed removal.  Patient amenable to this plan. - Plan: Ambulatory referral to Dermatology  Lung nodule  -Follow-up chest x-ray ordered for lung nodules likely calcified granulomatas seen on x-ray from 02/27/2004 - Plan: DG Chest 2 View  Follow-up PRN in the next few months for CPE/lipid panel.  Abbe Amsterdam, MD

## 2018-02-15 DIAGNOSIS — H109 Unspecified conjunctivitis: Secondary | ICD-10-CM | POA: Diagnosis not present

## 2018-02-21 DIAGNOSIS — H109 Unspecified conjunctivitis: Secondary | ICD-10-CM | POA: Diagnosis not present

## 2018-02-21 DIAGNOSIS — L03213 Periorbital cellulitis: Secondary | ICD-10-CM | POA: Diagnosis not present

## 2019-01-06 ENCOUNTER — Encounter (HOSPITAL_COMMUNITY): Payer: Self-pay

## 2019-01-06 ENCOUNTER — Other Ambulatory Visit: Payer: Self-pay

## 2019-01-06 ENCOUNTER — Emergency Department (HOSPITAL_COMMUNITY)
Admission: EM | Admit: 2019-01-06 | Discharge: 2019-01-06 | Disposition: A | Discharge status: Home / Self Care | Payer: 59 | Attending: Emergency Medicine | Admitting: Emergency Medicine

## 2019-01-06 ENCOUNTER — Emergency Department (HOSPITAL_COMMUNITY): Payer: 59

## 2019-01-06 DIAGNOSIS — N201 Calculus of ureter: Secondary | ICD-10-CM | POA: Diagnosis not present

## 2019-01-06 DIAGNOSIS — R1031 Right lower quadrant pain: Secondary | ICD-10-CM | POA: Diagnosis present

## 2019-01-06 LAB — CBC WITH DIFFERENTIAL/PLATELET
Abs Immature Granulocytes: 0.03 10*3/uL (ref 0.00–0.07)
Basophils Absolute: 0 10*3/uL (ref 0.0–0.1)
Basophils Relative: 0 %
Eosinophils Absolute: 0 10*3/uL (ref 0.0–0.5)
Eosinophils Relative: 0 %
HCT: 43.8 % (ref 39.0–52.0)
Hemoglobin: 14.3 g/dL (ref 13.0–17.0)
Immature Granulocytes: 0 %
Lymphocytes Relative: 12 %
Lymphs Abs: 1.2 10*3/uL (ref 0.7–4.0)
MCH: 32.8 pg (ref 26.0–34.0)
MCHC: 32.6 g/dL (ref 30.0–36.0)
MCV: 100.5 fL — ABNORMAL HIGH (ref 80.0–100.0)
Monocytes Absolute: 0.7 10*3/uL (ref 0.1–1.0)
Monocytes Relative: 7 %
Neutro Abs: 7.4 10*3/uL (ref 1.7–7.7)
Neutrophils Relative %: 81 %
Platelets: 166 10*3/uL (ref 150–400)
RBC: 4.36 MIL/uL (ref 4.22–5.81)
RDW: 12.2 % (ref 11.5–15.5)
WBC: 9.3 10*3/uL (ref 4.0–10.5)
nRBC: 0 % (ref 0.0–0.2)

## 2019-01-06 LAB — URINALYSIS, ROUTINE W REFLEX MICROSCOPIC
Bilirubin Urine: NEGATIVE
Glucose, UA: NEGATIVE mg/dL
Hgb urine dipstick: NEGATIVE
Ketones, ur: NEGATIVE mg/dL
Leukocytes,Ua: NEGATIVE
Nitrite: NEGATIVE
Protein, ur: NEGATIVE mg/dL
Specific Gravity, Urine: 1.023 (ref 1.005–1.030)
pH: 5 (ref 5.0–8.0)

## 2019-01-06 LAB — COMPREHENSIVE METABOLIC PANEL
ALT: 14 U/L (ref 0–44)
AST: 31 U/L (ref 15–41)
Albumin: 4.5 g/dL (ref 3.5–5.0)
Alkaline Phosphatase: 54 U/L (ref 38–126)
Anion gap: 11 (ref 5–15)
BUN: 25 mg/dL — ABNORMAL HIGH (ref 8–23)
CO2: 22 mmol/L (ref 22–32)
Calcium: 8.9 mg/dL (ref 8.9–10.3)
Chloride: 105 mmol/L (ref 98–111)
Creatinine, Ser: 1.14 mg/dL (ref 0.61–1.24)
GFR calc Af Amer: >60 mL/min (ref >60)
GFR calc non Af Amer: >60 mL/min (ref >60)
Glucose, Bld: 131 mg/dL — ABNORMAL HIGH (ref 70–99)
Potassium: 4.4 mmol/L (ref 3.5–5.1)
Sodium: 138 mmol/L (ref 135–145)
Total Bilirubin: 1.2 mg/dL (ref 0.3–1.2)
Total Protein: 7 g/dL (ref 6.5–8.1)

## 2019-01-06 LAB — LIPASE, BLOOD: Lipase: 29 U/L (ref 11–51)

## 2019-01-06 IMAGING — CT CT RENAL STONE PROTOCOL
2 of 4 series · 16 of 46 positions shown, 18 images · non-contrast
Comparison: Patient's prior CT abdomen pelvis from [DATE]
not available for comparison.

CLINICAL DATA: Right lower quadrant and right flank pain since this
morning.

EXAM:
CT ABDOMEN AND PELVIS WITHOUT CONTRAST
TECHNIQUE: Multidetector CT imaging of the abdomen and pelvis was performed
following the standard protocol without IV contrast.

[Series 2: axial st · axial · 0.78mm/px · z∈[+801,+1231]mm · 13 of 98 slices shown, 15 images]
[im 6/98  soft-tissue]
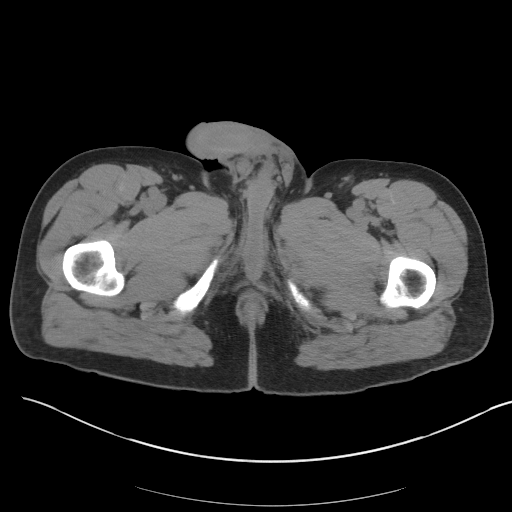
[im 6/98  bone]
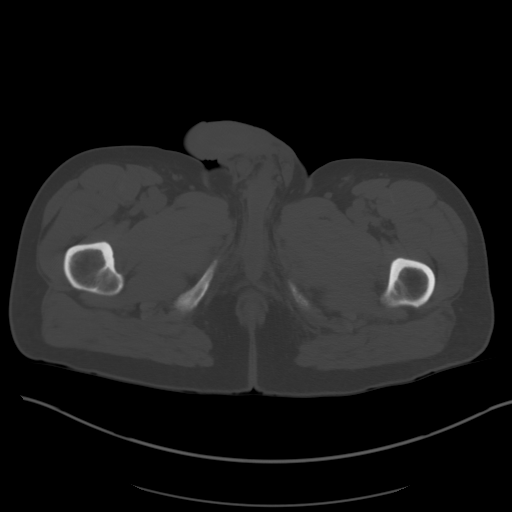
[im 16/98  soft-tissue]
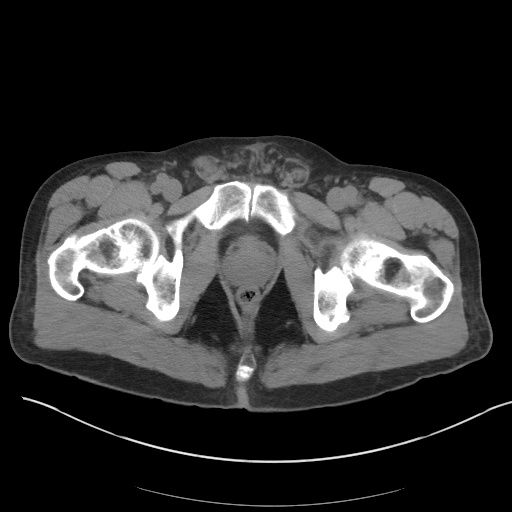
[im 21/98  soft-tissue]
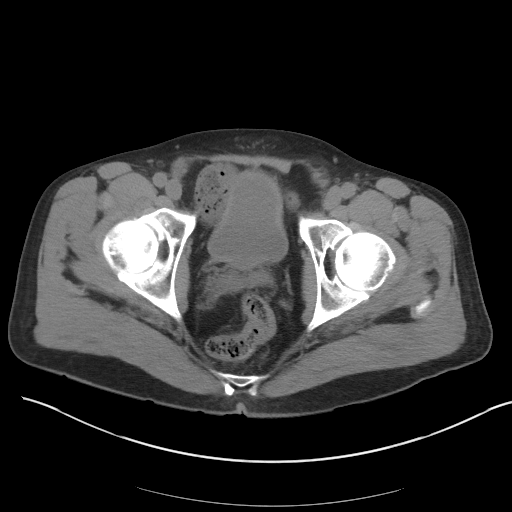
[im 26/98  soft-tissue]
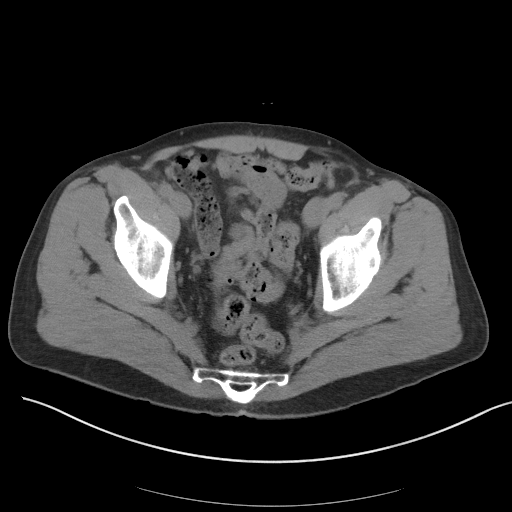
[im 36/98  soft-tissue]
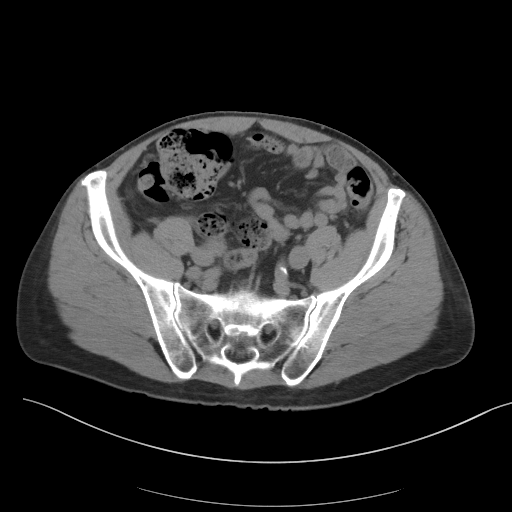
[im 41/98  soft-tissue]
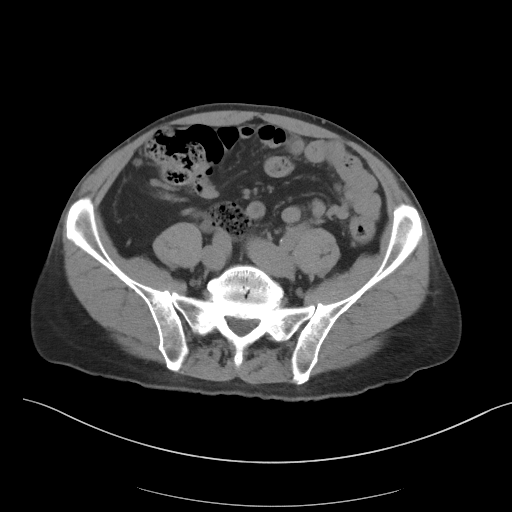
[im 52/98  soft-tissue]
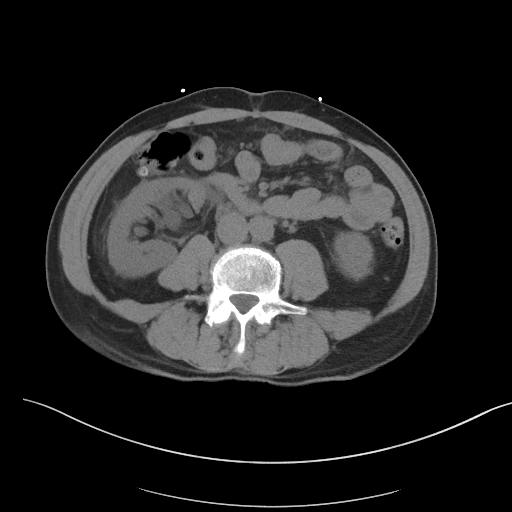
[im 57/98  soft-tissue]
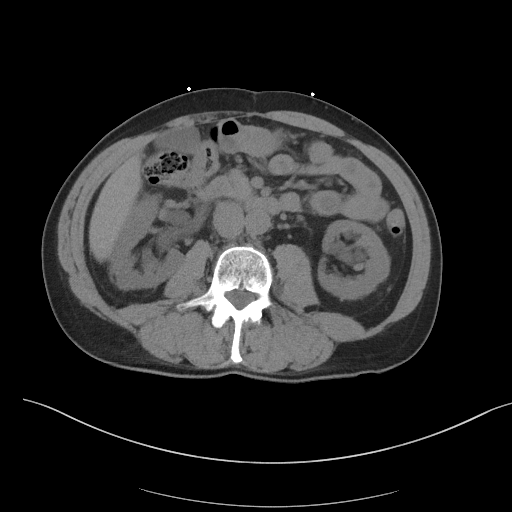
[im 62/98  soft-tissue]
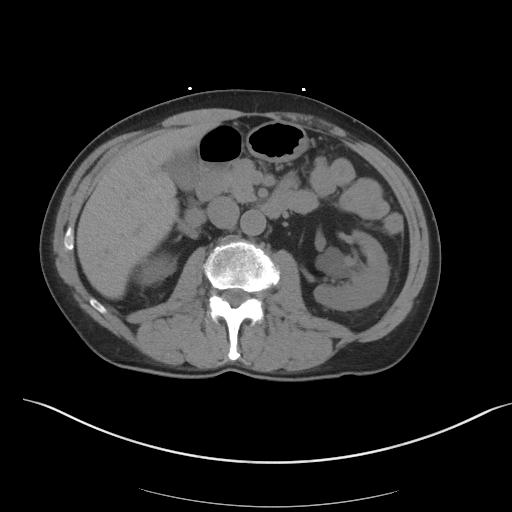
[im 62/98  bone]
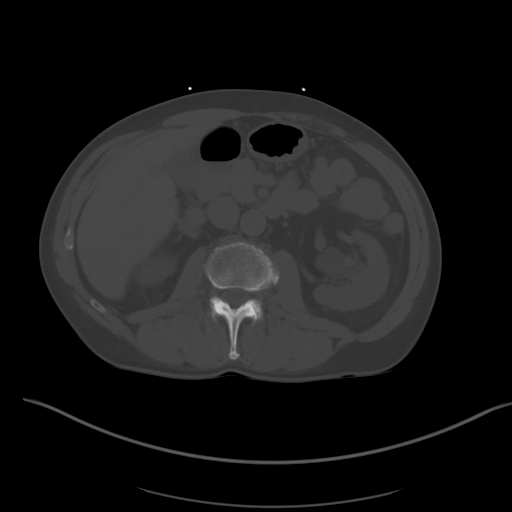
[im 72/98  soft-tissue]
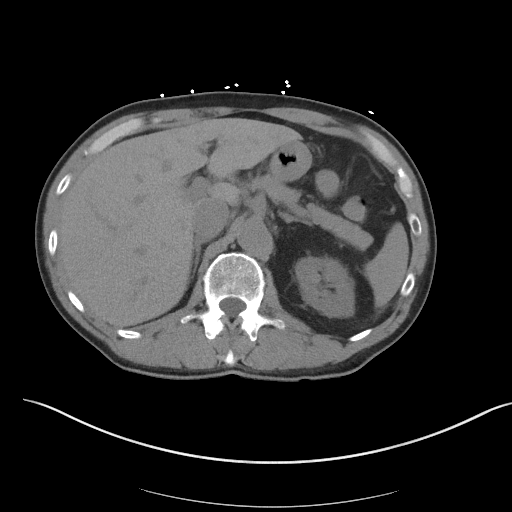
[im 77/98  soft-tissue]
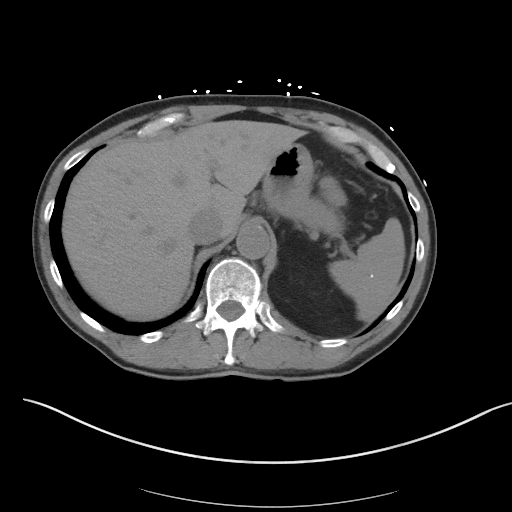
[im 82/98  soft-tissue]
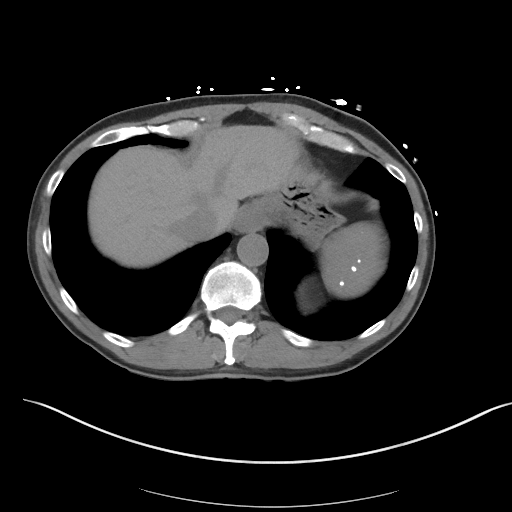
[im 92/98  soft-tissue]
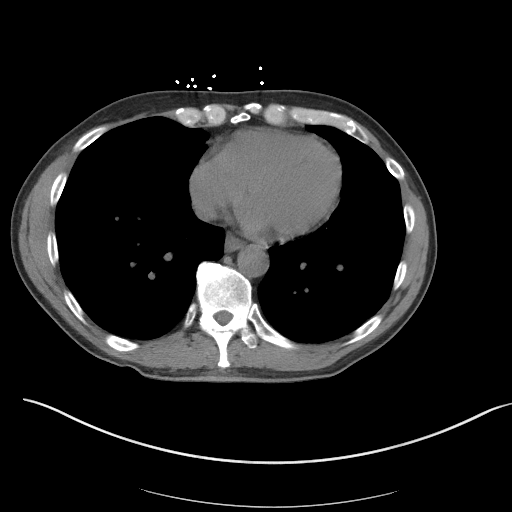

[Series 5: coronal st · coronal · 0.68mm/px · 3 of 92 slices shown]
[im 31/92  soft-tissue]
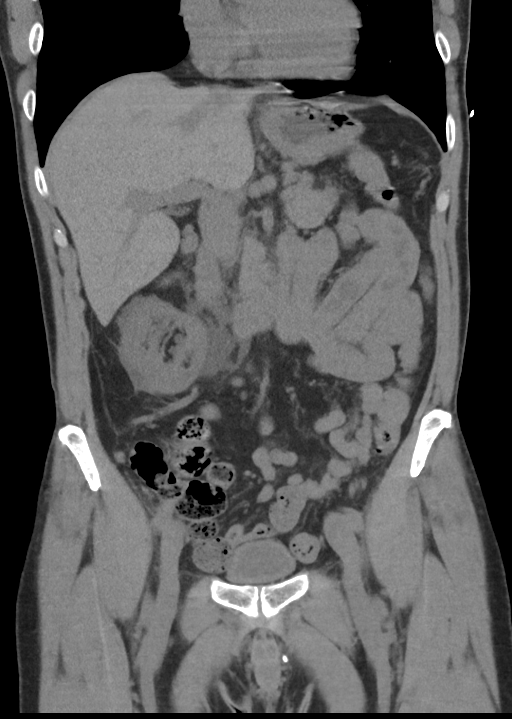
[im 41/92  soft-tissue]
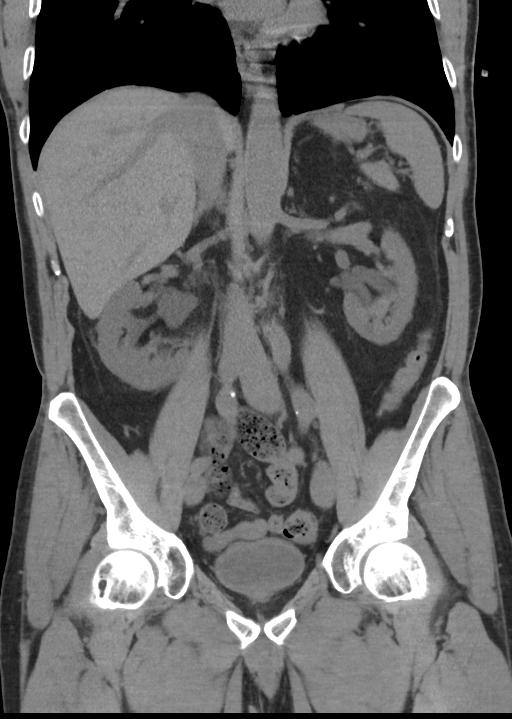
[im 51/92  soft-tissue]
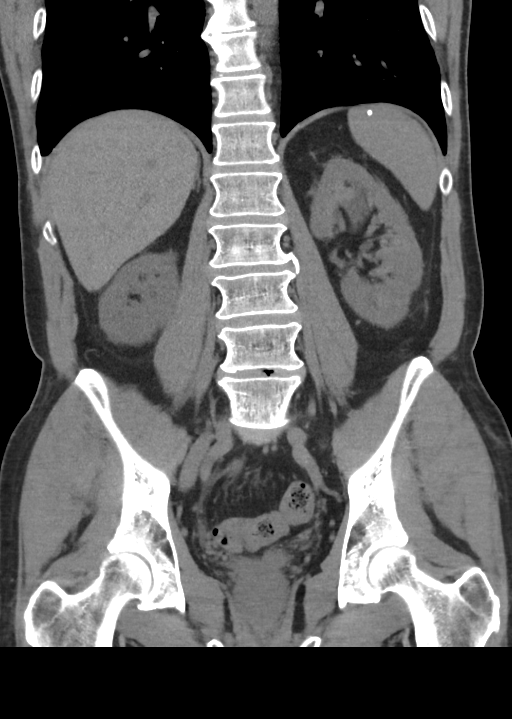

[16 of 46 positions shown; findings below may reference images not displayed]

FINDINGS: Lower chest: No acute abnormality.

Hepatobiliary: No focal liver abnormality is seen. No gallstones,
gallbladder wall thickening, or biliary dilatation.

Pancreas: Unremarkable. No pancreatic ductal dilatation or
surrounding inflammatory changes.

Spleen: Normal in size without focal abnormality.

Adrenals/Urinary Tract: The bilateral adrenal glands are normal.
Multiple bilateral parapelvic renal cysts are identified. There is
right perinephric stranding and right perinephric fluid. There is a
1 mm calcific density in the distal right ureter suggesting a stone.
There is no left hydronephrosis. There is a 2 mm nonobstructing
stone in the posterior lower pole right kidney. The bladder is
normal.

Stomach/Bowel: Stomach is within normal limits. Appendix appears
normal. No evidence of bowel wall thickening, distention, or
inflammatory changes.

Vascular/Lymphatic: Aortic atherosclerosis. No enlarged abdominal or
pelvic lymph nodes.

Reproductive: Prostate is unremarkable.

Other: None

Musculoskeletal: Degenerative joint changes of the spine are noted.
IMPRESSION: Right perinephric stranding and right perinephric fluid. There is a
1 mm calcific density in the distal right ureter suggesting a stone.
Right hydronephrosis is difficult to assess due to the extensive
parapelvic renal cysts present.

## 2019-01-06 MED ORDER — OXYCODONE-ACETAMINOPHEN 5-325 MG PO TABS: 1.0000 | ORAL_TABLET | Freq: Four times a day (QID) | ORAL | 0 refills | Status: DC | PRN

## 2019-01-06 MED ORDER — MORPHINE SULFATE (PF) 4 MG/ML IV SOLN
4.0000 mg | Freq: Once | INTRAVENOUS | Status: AC
  Administered 2019-01-06: 4 mg via INTRAVENOUS
  Filled 2019-01-06: qty 1

## 2019-01-06 MED ORDER — ONDANSETRON 4 MG PO TBDP: 4.0000 mg | ORAL_TABLET | Freq: Three times a day (TID) | ORAL | 0 refills | Status: DC | PRN

## 2019-01-06 MED ORDER — KETOROLAC TROMETHAMINE 30 MG/ML IJ SOLN
30.0000 mg | Freq: Once | INTRAMUSCULAR | Status: AC
  Administered 2019-01-06: 30 mg via INTRAVENOUS
  Filled 2019-01-06: qty 1

## 2019-01-06 MED ORDER — ONDANSETRON HCL 4 MG/2ML IJ SOLN
4.0000 mg | Freq: Once | INTRAMUSCULAR | Status: AC
  Administered 2019-01-06: 4 mg via INTRAVENOUS
  Filled 2019-01-06: qty 2

## 2019-01-06 MED ORDER — SODIUM CHLORIDE 0.9 % IV BOLUS
1000.0000 mL | Freq: Once | INTRAVENOUS | Status: AC
  Administered 2019-01-06: 1000 mL via INTRAVENOUS

## 2019-01-06 NOTE — ED Provider Notes (Signed)
[facility] Provider Note   CSN: [id number] Arrival date & time: 01/06/19  [id number]    History   Chief Complaint Chief Complaint  Patient presents with  . Abdominal Pain    HPI Clayton Mendoza is a 63 y.o. male.     Pt presents to the ED today with RLQ pain.  Pt said it woke him from sleep around 0400.  He has nausea, no vomiting or diarrhea.  No fever.  He has never had anything like this in the past.  No previous abd surgeries.  He has not been constipated.     Past Medical History:  Diagnosis Date  . Anemia   . Arrhythmia   . Depression   . Heart murmur   . Migraine   . Seizures (HCC)     There are no active problems to display for this patient.   Past Surgical History:  Procedure Laterality Date  . CARDIAC CATHETERIZATION          Home Medications    Prior to Admission medications   Medication Sig Start Date End Date Taking? Authorizing Provider  ALPRAZolam Prudy Feeler(XANAX) 0.5 MG tablet Take 1 tablet (0.5 mg total) by mouth daily as needed. For flying 08/13/15   Roddie McKordsmeier, Julia, FNP  hydrOXYzine (ATARAX/VISTARIL) 50 MG tablet Take 1-2 tablets as needed 1 hour prior to flying. 10/25/17   Deeann SaintBanks, Shannon R, MD  ondansetron (ZOFRAN ODT) 4 MG disintegrating tablet Take 1 tablet (4 mg total) by mouth every 8 (eight) hours as needed. 01/06/19   Jacalyn LefevreHaviland, Daeshaun Specht, MD  oxyCODONE-acetaminophen (PERCOCET/ROXICET) 5-325 MG tablet Take 1 tablet by mouth every 6 (six) hours as needed for severe pain. 01/06/19   Jacalyn LefevreHaviland, Arcenio Mullaly, MD    Family History Family History  Problem Relation Age of Onset  . Atrial fibrillation Mother   . Heart disease Mother   . Cancer Father        prostate  . Dementia Father     Social History Social History   Tobacco Use  . Smoking status: Never Smoker  . Smokeless tobacco: Never Used  Substance Use Topics  . Alcohol use: Yes    Alcohol/week: 5.0 standard drinks    Types: 5 Standard drinks or equivalent per week    Comment:  wine with dinner  . Drug use: No     Allergies   Patient has no known allergies.   Review of Systems Review of Systems  Gastrointestinal: Positive for abdominal pain.  All other systems reviewed and are negative.    Physical Exam Updated Vital Signs BP 119/61   Pulse 73   Temp 98.2 F (36.8 C) (Oral)   Resp 18   Ht 6' (1.829 m)   Wt 74.8 kg   SpO2 96%   BMI 22.38 kg/m   Physical Exam Vitals signs and nursing note reviewed.  Constitutional:      Appearance: He is well-developed.  HENT:     Head: Normocephalic and atraumatic.     Mouth/Throat:     Mouth: Mucous membranes are moist.  Eyes:     Extraocular Movements: Extraocular movements intact.     Pupils: Pupils are equal, round, and reactive to light.  Cardiovascular:     Rate and Rhythm: Normal rate and regular rhythm.     Heart sounds: Normal heart sounds.  Pulmonary:     Effort: Pulmonary effort is normal.     Breath sounds: Normal breath sounds.  Abdominal:     General:  Abdomen is flat.     Palpations: Abdomen is soft.     Tenderness: There is abdominal tenderness in the right lower quadrant.  Skin:    General: Skin is warm.     Capillary Refill: Capillary refill takes less than 2 seconds.  Neurological:     General: No focal deficit present.     Mental Status: He is alert and oriented to person, place, and time.  Psychiatric:        Mood and Affect: Mood normal.        Behavior: Behavior normal.      ED Treatments / Results  Labs (all labs ordered are listed, but only abnormal results are displayed) Labs Reviewed  CBC WITH DIFFERENTIAL/PLATELET - Abnormal; Notable for the following components:      Result Value   MCV 100.5 (*)    All other components within normal limits  COMPREHENSIVE METABOLIC PANEL - Abnormal; Notable for the following components:   Glucose, Bld 131 (*)    BUN 25 (*)    All other components within normal limits  LIPASE, BLOOD  URINALYSIS, ROUTINE W REFLEX MICROSCOPIC     EKG None  Radiology Ct Renal Stone Study  Result Date: 01/06/2019 CLINICAL DATA:  Right lower quadrant and right flank pain since this morning. EXAM: CT ABDOMEN AND PELVIS WITHOUT CONTRAST TECHNIQUE: Multidetector CT imaging of the abdomen and pelvis was performed following the standard protocol without IV contrast. COMPARISON:  Patient's prior CT abdomen pelvis from August 04, 2004 not available for comparison. FINDINGS: Lower chest: No acute abnormality. Hepatobiliary: No focal liver abnormality is seen. No gallstones, gallbladder wall thickening, or biliary dilatation. Pancreas: Unremarkable. No pancreatic ductal dilatation or surrounding inflammatory changes. Spleen: Normal in size without focal abnormality. Adrenals/Urinary Tract: The bilateral adrenal glands are normal. Multiple bilateral parapelvic renal cysts are identified. There is right perinephric stranding and right perinephric fluid. There is a 1 mm calcific density in the distal right ureter suggesting a stone. There is no left hydronephrosis. There is a 2 mm nonobstructing stone in the posterior lower pole right kidney. The bladder is normal. Stomach/Bowel: Stomach is within normal limits. Appendix appears normal. No evidence of bowel wall thickening, distention, or inflammatory changes. Vascular/Lymphatic: Aortic atherosclerosis. No enlarged abdominal or pelvic lymph nodes. Reproductive: Prostate is unremarkable. Other: None Musculoskeletal: Degenerative joint changes of the spine are noted. IMPRESSION: Right perinephric stranding and right perinephric fluid. There is a 1 mm calcific density in the distal right ureter suggesting a stone. Right hydronephrosis is difficult to assess due to the extensive parapelvic renal cysts present. Electronically Signed   By: Sherian ReinWei-Chen  Lin M.D.   On: 01/06/2019 09:43    Procedures Procedures (including critical care time)  Medications Ordered in ED Medications  morphine 4 MG/ML injection 4 mg (4 mg  Intravenous Given 01/06/19 0741)  ondansetron (ZOFRAN) injection 4 mg (4 mg Intravenous Given 01/06/19 0740)  sodium chloride 0.9 % bolus 1,000 mL (0 mLs Intravenous Stopped 01/06/19 0840)  morphine 4 MG/ML injection 4 mg (4 mg Intravenous Given 01/06/19 0835)  ketorolac (TORADOL) 30 MG/ML injection 30 mg (30 mg Intravenous Given 01/06/19 1020)     Initial Impression / Assessment and Plan / ED Course  I have reviewed the triage vital signs and the nursing notes.  Pertinent labs & imaging results that were available during my care of the patient were reviewed by me and considered in my medical decision making (see chart for details).  Pt is feeling much better.  Pain is now gone.  He is ready to go home.  Pt knows to return if worse.  F/u with urology.  Final Clinical Impressions(s) / ED Diagnoses   Final diagnoses:  Right ureteral stone    ED Discharge Orders         Ordered    oxyCODONE-acetaminophen (PERCOCET/ROXICET) 5-325 MG tablet  Every 6 hours PRN     01/06/19 1051    ondansetron (ZOFRAN ODT) 4 MG disintegrating tablet  Every 8 hours PRN     01/06/19 1051           Isla Pence, MD 01/06/19 1052

## 2019-01-06 NOTE — ED Triage Notes (Signed)
Pt c/o pain in rlq since 0420 this morning.  Reports nausea, no vomiting.  Denies diarrhea.  LBM was this morning.  Reports has had some generalized abd cramping recently.  Denies any cough, fever, or sob.

## 2019-01-18 ENCOUNTER — Other Ambulatory Visit: Payer: Self-pay

## 2019-01-18 DIAGNOSIS — Z20822 Contact with and (suspected) exposure to covid-19: Secondary | ICD-10-CM

## 2019-01-20 LAB — NOVEL CORONAVIRUS, NAA: SARS-CoV-2, NAA: NOT DETECTED

## 2019-01-21 ENCOUNTER — Other Ambulatory Visit: Payer: Self-pay

## 2019-01-21 ENCOUNTER — Telehealth (INDEPENDENT_AMBULATORY_CARE_PROVIDER_SITE_OTHER): Payer: 59 | Admitting: Family Medicine

## 2019-01-21 DIAGNOSIS — N200 Calculus of kidney: Secondary | ICD-10-CM | POA: Diagnosis not present

## 2019-01-21 DIAGNOSIS — R490 Dysphonia: Secondary | ICD-10-CM | POA: Diagnosis not present

## 2019-01-21 DIAGNOSIS — K219 Gastro-esophageal reflux disease without esophagitis: Secondary | ICD-10-CM | POA: Diagnosis not present

## 2019-01-21 MED ORDER — OMEPRAZOLE 20 MG PO CPDR: 20.0000 mg | DELAYED_RELEASE_CAPSULE | Freq: Every day | ORAL | 3 refills | Status: DC

## 2019-01-21 NOTE — Progress Notes (Signed)
Virtual Visit via Video Note  I connected with Clayton Mendoza on 01/21/19 at  9:00 AM EDT by a video enabled telemedicine application 2/2 [id number] pandemic and verified that I am speaking with the correct person using two identifiers.  Location patient: home Location provider:work or home office Persons participating in the virtual visit: patient, provider  I discussed the limitations of evaluation and management by telemedicine and the availability of in person appointments. The patient expressed understanding and agreed to proceed.   HPI: 2 wks ago pt had a 1 mm kidney stone, went to the ED 8/2.  Stone has since passed. No longer having pain.   A few days later pt felt like he needed to clear his throat, wasn't really coughing.  On 8/9 had an evisit with [facility], was given prednisone and Azithromycin.  Pt noticed no change after taking the meds.  On Thursday pt felt sick for the first time.  Pt got a COVID test last wk, Sunday was told testing was negative.  Now weakness, numbness in lower legs, frequent urination (3x in 30 min) on Saturday.  Also notes difficulty focusing.    Pt has a h/o acid.  Not on meds.  Eating dinner late, 10 pm or 10:30 pm, then going to bed.  Pt notes a hoarseness to his voice.  Has lost weight since March, was 165-170lbs , now 159lbs.  States exercising some.  Notes working from home sine the start of the pandemic.  Pt states has to work from his car, as the wifi signal in his home is bad.   ROS: See pertinent positives and negatives per HPI.  Past Medical History:  Diagnosis Date  . Anemia   . Arrhythmia   . Depression   . Heart murmur   . Migraine   . Seizures ([place])     Past Surgical History:  Procedure Laterality Date  . CARDIAC CATHETERIZATION      Family History  Problem Relation Age of Onset  . Atrial fibrillation Mother   . Heart disease Mother   . Cancer Father        prostate  . Dementia Father      Current Outpatient Medications:  .   ALPRAZolam (XANAX) 0.5 MG tablet, Take 1 tablet (0.5 mg total) by mouth daily as needed. For flying, Disp: 30 tablet, Rfl: 0 .  hydrOXYzine (ATARAX/VISTARIL) 50 MG tablet, Take 1-2 tablets as needed 1 hour prior to flying., Disp: 60 tablet, Rfl: 0 .  ondansetron (ZOFRAN ODT) 4 MG disintegrating tablet, Take 1 tablet (4 mg total) by mouth every 8 (eight) hours as needed., Disp: 10 tablet, Rfl: 0 .  oxyCODONE-acetaminophen (PERCOCET/ROXICET) 5-325 MG tablet, Take 1 tablet by mouth every 6 (six) hours as needed for severe pain., Disp: 15 tablet, Rfl: 0  EXAM:  VITALS per patient if applicable:  GENERAL: alert, oriented, appears well and in no acute distress  HEENT: atraumatic, conjunctiva clear, no obvious abnormalities on inspection of external nose and ears  NECK: normal movements of the head and neck  LUNGS: on inspection no signs of respiratory distress, breathing rate appears normal, no obvious gross SOB, gasping or wheezing  CV: no obvious cyanosis  MS: moves all visible extremities without noticeable abnormality  PSYCH/NEURO: pleasant and cooperative, no obvious depression or anxiety, speech and thought processing grossly intact  ASSESSMENT AND PLAN:  Discussed the following assessment and plan:  Voice hoarseness  -possibly 2/2 viral, post nasal drainage, or silent reflux -given h/o  acid reflux will start PPI. - Plan: omeprazole (PRILOSEC) 20 MG capsule  Gastroesophageal reflux disease, esophagitis presence not specified   -discussed avoiding eating so late at night.  Avoid laying down <2 hours after eating - Plan: omeprazole (PRILOSEC) 20 MG capsule  Renal calculi  -a 1 mm and a 2 mm stone notes on CT stone study 01/06/19 -discussed possibly passed 2nd stone -pt encouraged to increase intake of fluids -given precautions  F/u prn in the next few wks for continued symptoms.   I discussed the assessment and treatment plan with the patient. The patient was provided an  opportunity to ask questions and all were answered. The patient agreed with the plan and demonstrated an understanding of the instructions.   The patient was advised to call back or seek an in-person evaluation if the symptoms worsen or if the condition fails to improve as anticipated.     Deeann SaintShannon R Thamas Appleyard, MD

## 2019-01-28 ENCOUNTER — Other Ambulatory Visit: Payer: Self-pay

## 2019-01-28 ENCOUNTER — Telehealth (INDEPENDENT_AMBULATORY_CARE_PROVIDER_SITE_OTHER): Payer: 59 | Admitting: Family Medicine

## 2019-01-28 DIAGNOSIS — K219 Gastro-esophageal reflux disease without esophagitis: Secondary | ICD-10-CM | POA: Diagnosis not present

## 2019-01-28 DIAGNOSIS — R42 Dizziness and giddiness: Secondary | ICD-10-CM | POA: Diagnosis not present

## 2019-01-28 DIAGNOSIS — Q6102 Congenital multiple renal cysts: Secondary | ICD-10-CM | POA: Diagnosis not present

## 2019-01-28 DIAGNOSIS — K5900 Constipation, unspecified: Secondary | ICD-10-CM | POA: Diagnosis not present

## 2019-01-28 NOTE — Progress Notes (Signed)
Virtual Visit via Video Note  I connected with Clayton Mendoza on 01/28/19 at  8:30 AM EDT by a video enabled telemedicine application 2/2 COVID-19 pandemic and verified that I am speaking with the correct person using two identifiers.  Location patient: home Location provider:work or home office Persons participating in the virtual visit: patient, provider  I discussed the limitations of evaluation and management by telemedicine and the availability of in person appointments. The patient expressed understanding and agreed to proceed.   HPI: Pt started taking omeprazole x 1 wk.  Notice improvement in voice hoarseness and the sensation of needing to clear his throat has improved.  Pt eats vegan at home (wife is vegan), but eats meat when away from home.    Constipation started during episode with kidney stones a few wks ago.  Not drinking any water. Has a few cups of tea.  Trying to eat more raw veggies and fruits.  Was given rx for percocet for kidney stone pain.  Pt notes episodes of low blood sugar where he feels lightheaded.  States may go 12 hours between meals. Will eat lunch around 11:30 am, then have dinner around 10 pm as his wife gets home late.  Has tried to eat snacks like nuts in the afternoon.    Pt inquires about several finding noted on the CT abd and pelvis including atherosclerosis and cysts in kidney.  Pt denies previous h/o renal cyst.  Asymptomatic. Creatinine was 1.14 during kidney stone episode.    ROS: See pertinent positives and negatives per HPI.  Past Medical History:  Diagnosis Date  . Anemia   . Arrhythmia   . Depression   . Heart murmur   . Migraine   . Seizures ([facility])     Past Surgical History:  Procedure Laterality Date  . CARDIAC CATHETERIZATION      Family History  Problem Relation Age of Onset  . Atrial fibrillation Mother   . Heart disease Mother   . Cancer Father        prostate  . Dementia Father     Current Outpatient Medications:  .   ALPRAZolam (XANAX) 0.5 MG tablet, Take 1 tablet (0.5 mg total) by mouth daily as needed. For flying, Disp: 30 tablet, Rfl: 0 .  hydrOXYzine (ATARAX/VISTARIL) 50 MG tablet, Take 1-2 tablets as needed 1 hour prior to flying., Disp: 60 tablet, Rfl: 0 .  omeprazole (PRILOSEC) 20 MG capsule, Take 1 capsule (20 mg total) by mouth daily., Disp: 30 capsule, Rfl: 3 .  ondansetron (ZOFRAN ODT) 4 MG disintegrating tablet, Take 1 tablet (4 mg total) by mouth every 8 (eight) hours as needed., Disp: 10 tablet, Rfl: 0 .  oxyCODONE-acetaminophen (PERCOCET/ROXICET) 5-325 MG tablet, Take 1 tablet by mouth every 6 (six) hours as needed for severe pain., Disp: 15 tablet, Rfl: 0  EXAM:  VITALS per patient if applicable:  GENERAL: alert, oriented, appears well and in no acute distress  HEENT: atraumatic, conjunctiva clear, no obvious abnormalities on inspection of external nose and ears  NECK: normal movements of the head and neck  LUNGS: on inspection no signs of respiratory distress, breathing rate appears normal, no obvious gross SOB, gasping or wheezing  CV: no obvious cyanosis  MS: moves all visible extremities without noticeable abnormality  PSYCH/NEURO: pleasant and cooperative, no obvious depression or anxiety, speech and thought processing grossly intact  ASSESSMENT AND PLAN:  Discussed the following assessment and plan:  Gastroesophageal reflux disease, esophagitis presence not specified -hoarse voice  improving -continue omeprazole 20 mg -discussed avoiding foods known to cause problems. -pt to keep a food diary  Multiple renal cysts  -incidental finding on CT abd/pelvis 01/06/2019 -creatinine 1.14 on 01/06/19 during episode of kidney stones.  Baseline around 1.00 - Plan: Ambulatory referral to Nephrology  Lightheaded -likely 2/2 hypoglycemia from going extended periods of time without eating or dehydration -advised to have several small meals throughout the day or increase snacking between  breakfast, lunch, and dinner. -pt encouraged to increase po intake of water and fluids  Constipation, unspecified -likely 2/2 decreased intake of water and medication (given percocet for kidney stone pain, though no longer taking) -pt encouraged to increase po intake of water, fiber, and exercise. -miralax prn  F/u in 1 month   I discussed the assessment and treatment plan with the patient. The patient was provided an opportunity to ask questions and all were answered. The patient agreed with the plan and demonstrated an understanding of the instructions.   The patient was advised to call back or seek an in-person evaluation if the symptoms worsen or if the condition fails to improve as anticipated.   Billie Ruddy, MD

## 2019-02-15 ENCOUNTER — Encounter: Payer: Self-pay | Admitting: Family Medicine

## 2019-03-01 ENCOUNTER — Other Ambulatory Visit: Payer: Self-pay | Admitting: Family Medicine

## 2019-03-01 DIAGNOSIS — Q6102 Congenital multiple renal cysts: Secondary | ICD-10-CM

## 2019-04-18 ENCOUNTER — Other Ambulatory Visit: Payer: Self-pay | Admitting: Family Medicine

## 2019-04-18 DIAGNOSIS — R490 Dysphonia: Secondary | ICD-10-CM

## 2019-04-18 DIAGNOSIS — K219 Gastro-esophageal reflux disease without esophagitis: Secondary | ICD-10-CM

## 2019-07-28 ENCOUNTER — Ambulatory Visit: Payer: 59 | Attending: Internal Medicine

## 2019-07-28 DIAGNOSIS — Z23 Encounter for immunization: Secondary | ICD-10-CM | POA: Insufficient documentation

## 2019-07-28 NOTE — Progress Notes (Signed)
   Covid-19 Vaccination Clinic  Name:  Clayton Mendoza    MRN: [id number] DOB: Jan 04, 1956  07/28/2019  Clayton Mendoza was observed post Covid-19 immunization for 15 minutes without incidence. He was provided with Vaccine Information Sheet and instruction to access the V-Safe system.   Clayton Mendoza was instructed to call 911 with any severe reactions post vaccine: Marland Kitchen Difficulty breathing  . Swelling of your face and throat  . A fast heartbeat  . A bad rash all over your body  . Dizziness and weakness    Immunizations Administered    Name Date Dose VIS Date Route   Pfizer COVID-19 Vaccine 07/28/2019 10:55 AM 0.3 mL 05/17/2019 Intramuscular   Manufacturer: [employer], [employer]   Lot: [id number]   NDC: [address]-7

## 2019-08-20 ENCOUNTER — Ambulatory Visit: Payer: 59 | Attending: Internal Medicine

## 2019-08-20 DIAGNOSIS — Z23 Encounter for immunization: Secondary | ICD-10-CM

## 2019-08-20 NOTE — Progress Notes (Signed)
   Covid-19 Vaccination Clinic  Name:  BUREL KAHRE    MRN: [id number] DOB: Jan 01, 1956  08/20/2019  Mr. Kostelnik was observed post Covid-19 immunization for 15 minutes without incident. He was provided with Vaccine Information Sheet and instruction to access the V-Safe system.   Mr. Hiltz was instructed to call 911 with any severe reactions post vaccine: Marland Kitchen Difficulty breathing  . Swelling of face and throat  . A fast heartbeat  . A bad rash all over body  . Dizziness and weakness   Immunizations Administered    Name Date Dose VIS Date Route   Pfizer COVID-19 Vaccine 08/20/2019  8:55 AM 0.3 mL 05/17/2019 Intramuscular   Manufacturer: [employer], [employer]   Lot: [id number]   NDC: [address]-1

## 2019-10-11 ENCOUNTER — Other Ambulatory Visit: Payer: Self-pay | Admitting: Family Medicine

## 2019-10-11 DIAGNOSIS — K219 Gastro-esophageal reflux disease without esophagitis: Secondary | ICD-10-CM

## 2019-10-11 DIAGNOSIS — R490 Dysphonia: Secondary | ICD-10-CM

## 2020-05-28 ENCOUNTER — Other Ambulatory Visit: Payer: Self-pay | Admitting: Urology

## 2020-05-28 DIAGNOSIS — N281 Cyst of kidney, acquired: Secondary | ICD-10-CM

## 2020-06-04 ENCOUNTER — Ambulatory Visit (HOSPITAL_COMMUNITY): Payer: 59

## 2021-02-03 ENCOUNTER — Emergency Department (HOSPITAL_COMMUNITY): Payer: 59

## 2021-02-03 ENCOUNTER — Emergency Department (HOSPITAL_COMMUNITY)
Admission: EM | Admit: 2021-02-03 | Discharge: 2021-02-03 | Disposition: A | Discharge status: Home / Self Care | Payer: 59 | Attending: Emergency Medicine | Admitting: Emergency Medicine

## 2021-02-03 ENCOUNTER — Other Ambulatory Visit: Payer: Self-pay

## 2021-02-03 ENCOUNTER — Encounter (HOSPITAL_COMMUNITY): Payer: Self-pay | Admitting: Emergency Medicine

## 2021-02-03 DIAGNOSIS — M25552 Pain in left hip: Secondary | ICD-10-CM | POA: Diagnosis present

## 2021-02-03 HISTORY — DX: Disorder of kidney and ureter, unspecified: N28.9

## 2021-02-03 IMAGING — DX DG HIP (WITH OR WITHOUT PELVIS) 2-3V*L*
3 series · 3 of 3 positions shown · non-contrast
Comparison: Renal stone CT [DATE]

CLINICAL DATA: Left anterolateral hip pain after walking yesterday.
No specific injury.

EXAM:
DG HIP (WITH OR WITHOUT PELVIS) 2-3V LEFT

[pelvis ap]
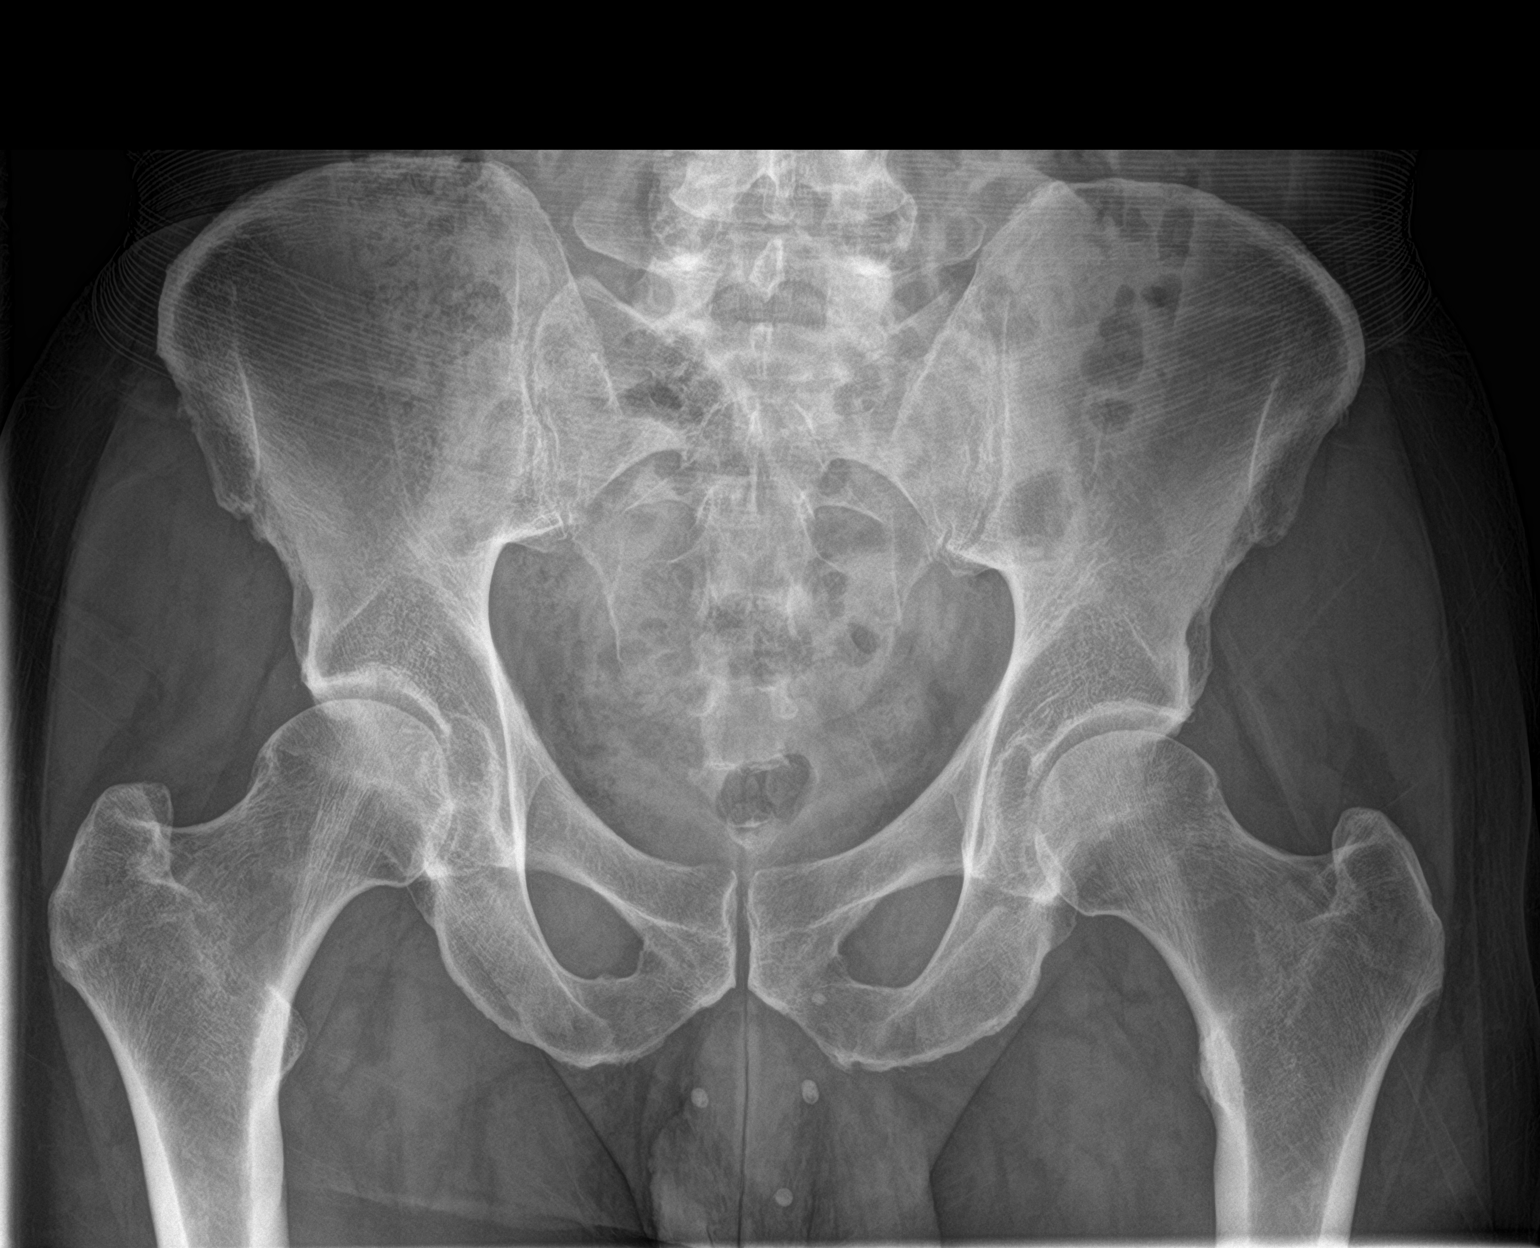

[hip ap]
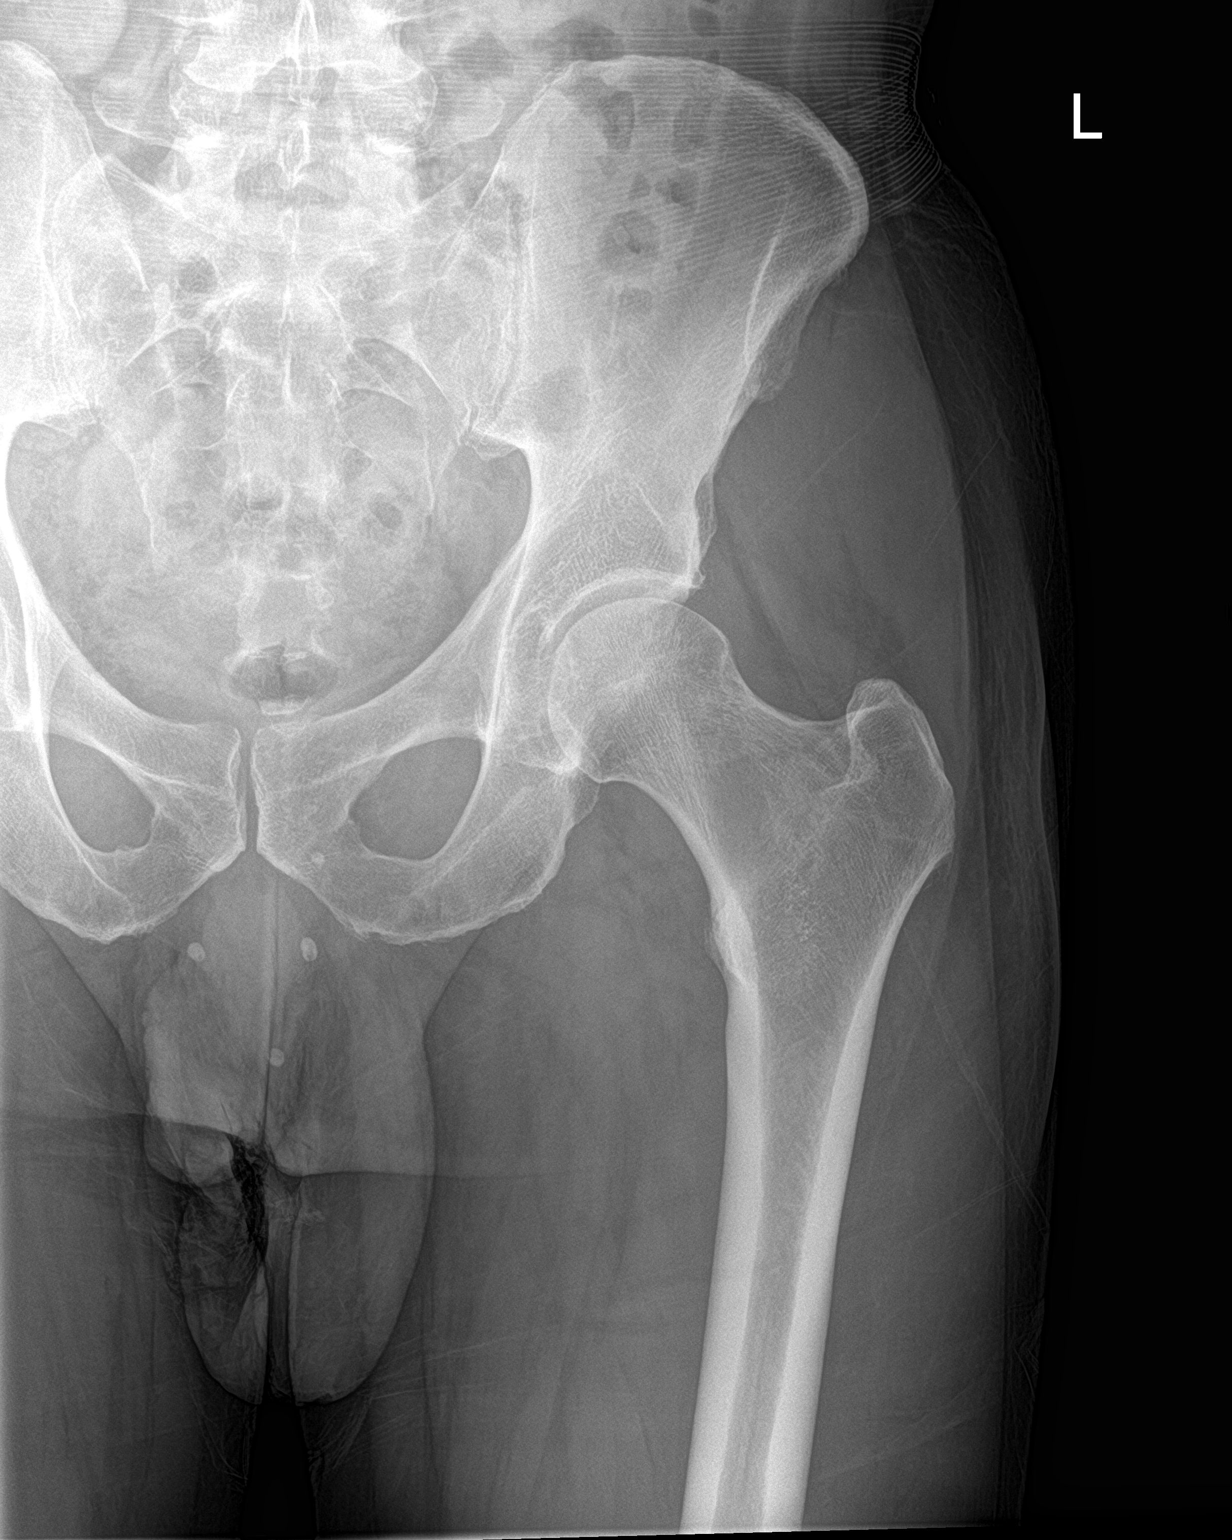

[hip lat]
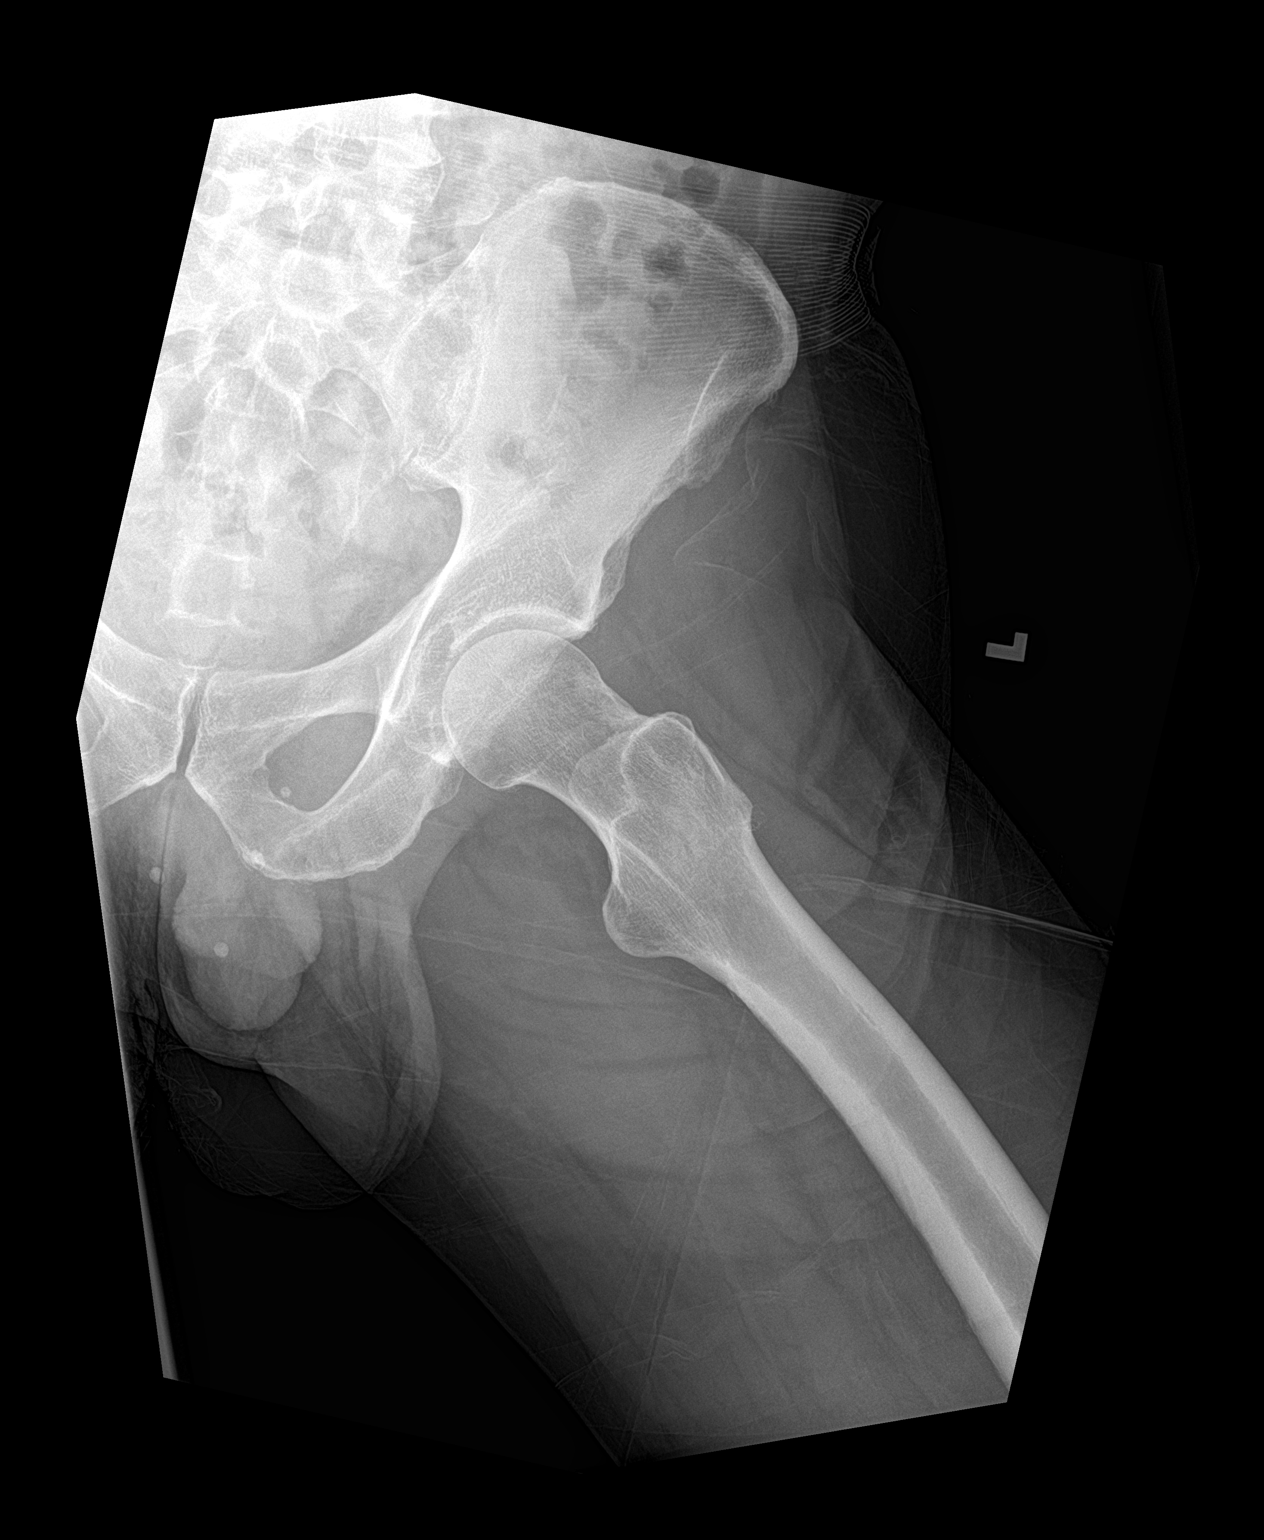

[3 of 3 positions shown; findings below may reference images not displayed]

FINDINGS: There is no evidence of hip fracture or dislocation. There is no
evidence of arthropathy or other focal bone abnormality.
IMPRESSION: Negative.

## 2021-02-03 MED ORDER — KETOROLAC TROMETHAMINE 60 MG/2ML IM SOLN
30.0000 mg | Freq: Once | INTRAMUSCULAR | Status: AC
  Administered 2021-02-03: 30 mg via INTRAMUSCULAR
  Filled 2021-02-03: qty 2

## 2021-02-03 MED ORDER — METHOCARBAMOL 500 MG PO TABS
500.0000 mg | ORAL_TABLET | Freq: Once | ORAL | Status: AC
  Administered 2021-02-03: 500 mg via ORAL
  Filled 2021-02-03: qty 1

## 2021-02-03 MED ORDER — METHOCARBAMOL 500 MG PO TABS: 500.0000 mg | ORAL_TABLET | Freq: Three times a day (TID) | ORAL | 0 refills | Status: DC | PRN

## 2021-02-03 NOTE — ED Triage Notes (Signed)
Pt c/o left hip pain since walking yesterday.

## 2021-02-04 ENCOUNTER — Encounter: Payer: Self-pay | Admitting: Orthopaedic Surgery

## 2021-02-04 ENCOUNTER — Ambulatory Visit (INDEPENDENT_AMBULATORY_CARE_PROVIDER_SITE_OTHER): Payer: 59 | Admitting: Orthopaedic Surgery

## 2021-02-04 DIAGNOSIS — M5416 Radiculopathy, lumbar region: Secondary | ICD-10-CM

## 2021-02-04 MED ORDER — PREDNISONE 10 MG (21) PO TBPK: ORAL_TABLET | Freq: Every day | ORAL | 0 refills | Status: DC

## 2021-02-04 NOTE — Progress Notes (Signed)
Office Visit Note   Patient: Clayton Mendoza           Date of Birth: 25-Jun-1955           MRN: [id number] Visit Date: 02/04/2021              Requested by: Marily Memos, MD [address] [place],  [place] [address] PCP: Eliezer Lofts, MD   Assessment & Plan: Visit Diagnoses:  1. Left lumbar radiculitis     Plan: By exam is more likely coming from the lumbar spine.  In past years he has CT scan renal stone study 2021 it was noted he had some renal cysts and one kidney stone.  This showed some mid lumbar lateral spurring on the left side only.  Some disc space narrowing was noted and facet degenerative changes with foraminal narrowing on the CT abdomen.  Recheck 3 weeks.  We will place patient on some prednisone 10 mg 3 tablets daily x7 days.  He has persistent problems he can call and we can proceed with obtaining a lumbar MRI scan.  He likely has some lateral recess or foraminal stenosis with a left-sided disc bulge mid upper lumbar region consistent with the symptoms.    Follow-Up Instructions: Return in about 3 years (around 02/05/2024).   Orders:  No orders of the defined types were placed in this encounter.  Meds ordered this encounter  Medications   predniSONE (STERAPRED UNI-PAK 21 TAB) 10 MG (21) TBPK tablet    Sig: Take by mouth daily. Take 3 tablets daily with food for 7 days    Dispense:  21 tablet    Refill:  0      Procedures: No procedures performed   Clinical Data: No additional findings.   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  He was in the emergency department yesterday at [facility] had x-rays.  States he walks several blocks noticed increased pain with back to the office at work the rest the day and that evening had significant increase in pain.  When he sitting he states the pain is maybe a 2 when he is active it can increase to  7.  He states at times it is almost as severe as kidney stone pain.  Has to ambulate with a cane and is increased pain with weightbearing he does use some Aleve.  He has had previous heart catheterization in 1958 history of seizures 1977.  Does not smoke occasionally drinks 3 times a week.  1 history of seizures 1 kidney stone in the past .  Some problems with migraines acid reflux and anxiety.  He is followed by Dr.Kahvano at [facility] medical.PDMP is 000.  Review of Systems all other systems are noncontributory to HPI.   Objective: Vital Signs: Ht 6' (1.829 m)   Wt 170 lb (77.1 kg)   BMI 23.06 kg/m   Physical Exam Constitutional:      Appearance: He is well-developed.  HENT:     Head: Normocephalic and atraumatic.     Right Ear: External ear normal.     Left Ear: External ear normal.  Eyes:     Pupils: Pupils are equal, round, and reactive to light.  Neck:     Thyroid: No thyromegaly.     Trachea: No tracheal deviation.  Cardiovascular:     Rate  and Rhythm: Normal rate.  Pulmonary:     Effort: Pulmonary effort is normal.     Breath sounds: No wheezing.  Abdominal:     General: Bowel sounds are normal.     Palpations: Abdomen is soft.  Musculoskeletal:     Cervical back: Neck supple.  Skin:    General: Skin is warm and dry.     Capillary Refill: Capillary refill takes less than 2 seconds.  Neurological:     Mental Status: He is alert and oriented to person, place, and time.  Psychiatric:        Behavior: Behavior normal.        Thought Content: Thought content normal.        Judgment: Judgment normal.    Ortho Exam patient is amatory with a cane with left lower extremity limp.  Has some tenderness posterior to the trochanter some more of the sciatic notch.  Mild tenderness of the lumbar spine.  Some discomfort with straight leg raising at 90 degrees.  Knee and ankle jerk are intact.  Anterior tib EHL is intact.  Negative FABER test.  Specialty Comments:  No specialty  comments available.  Imaging: Narrative & Impression  CLINICAL DATA:  Left anterolateral hip pain after walking yesterday. No specific injury.   EXAM: DG HIP (WITH OR WITHOUT PELVIS) 2-3V LEFT   COMPARISON:  Renal stone CT 01/06/2019   FINDINGS: There is no evidence of hip fracture or dislocation. There is no evidence of arthropathy or other focal bone abnormality.   IMPRESSION: Negative.     Electronically Signed   By: Marnee Spring M.D.   On: 02/03/2021 05:00     PMFS History: Patient Active Problem List   Diagnosis Date Noted   Left lumbar radiculitis 02/05/2021   Past Medical History:  Diagnosis Date   Anemia    Arrhythmia    Depression    Heart murmur    Migraine    Renal disorder    Seizures ([facility])     Family History  Problem Relation Age of Onset   Atrial fibrillation Mother    Heart disease Mother    Cancer Father        prostate   Dementia Father     Past Surgical History:  Procedure Laterality Date   CARDIAC CATHETERIZATION     Social History   Occupational History   Occupation: Attorney  Tobacco Use   Smoking status: Never   Smokeless tobacco: Never  Substance and Sexual Activity   Alcohol use: Yes    Alcohol/week: 5.0 standard drinks    Types: 5 Standard drinks or equivalent per week    Comment: wine with dinner   Drug use: No   Sexual activity: Not on file

## 2021-02-04 NOTE — ED Provider Notes (Signed)
[facility] EMERGENCY DEPARTMENT Provider Note   CSN: [id number] Arrival date & time: 02/03/21  0320     History Chief Complaint  Patient presents with   Hip Pain    Clayton Mendoza is a 65 y.o. male.  Patient was walking [place] yesterday when he had acute onset of left-sided hip pain.  It was in his left groin radiated from upper hip down towards the inner side.  Patient states never had any this before.  Get better after resting little bit.  He states that he walked back from his lunch to his office with minimal pain but then later on the day he would walk up the steps and started hurt again.  He is unclear the etiology.  He did chop some wood but that was a few days ago.  No other trauma.  No other increased exertion.  No other associated symptoms.  No significant medical problems.   Hip Pain      Past Medical History:  Diagnosis Date   Anemia    Arrhythmia    Depression    Heart murmur    Migraine    Renal disorder    Seizures ([facility])     There are no problems to display for this patient.   Past Surgical History:  Procedure Laterality Date   CARDIAC CATHETERIZATION         Family History  Problem Relation Age of Onset   Atrial fibrillation Mother    Heart disease Mother    Cancer Father        prostate   Dementia Father     Social History   Tobacco Use   Smoking status: Never   Smokeless tobacco: Never  Substance Use Topics   Alcohol use: Yes    Alcohol/week: 5.0 standard drinks    Types: 5 Standard drinks or equivalent per week    Comment: wine with dinner   Drug use: No    Home Medications Prior to Admission medications   Medication Sig Start Date End Date Taking? Authorizing Provider  methocarbamol (ROBAXIN) 500 MG tablet Take 1 tablet (500 mg total) by mouth every 8 (eight) hours as needed for muscle spasms. 02/03/21  Yes Chidera Thivierge, Barbara Cower, MD  ALPRAZolam Prudy Feeler) 0.5 MG tablet Take 1 tablet (0.5 mg total) by mouth daily as needed. For flying  08/13/15   Roddie Mc, FNP  hydrOXYzine (ATARAX/VISTARIL) 50 MG tablet Take 1-2 tablets as needed 1 hour prior to flying. 10/25/17   Deeann Saint, MD  omeprazole (PRILOSEC) 20 MG capsule TAKE 1 CAPSULE BY MOUTH EVERY DAY 10/11/19   Deeann Saint, MD  ondansetron (ZOFRAN ODT) 4 MG disintegrating tablet Take 1 tablet (4 mg total) by mouth every 8 (eight) hours as needed. 01/06/19   Jacalyn Lefevre, MD  oxyCODONE-acetaminophen (PERCOCET/ROXICET) 5-325 MG tablet Take 1 tablet by mouth every 6 (six) hours as needed for severe pain. 01/06/19   Jacalyn Lefevre, MD    Allergies    Patient has no known allergies.  Review of Systems   Review of Systems  All other systems reviewed and are negative.  Physical Exam Updated Vital Signs BP (!) 146/67 (BP Location: Left Arm)   Pulse 62   Temp 98.5 F (36.9 C) (Oral)   Resp 19   Ht 6' (1.829 m)   Wt 74 kg   SpO2 98%   BMI 22.13 kg/m   Physical Exam Vitals and nursing note reviewed.  Constitutional:      Appearance:  He is well-developed.  HENT:     Head: Normocephalic and atraumatic.     Mouth/Throat:     Mouth: Mucous membranes are moist.     Pharynx: Oropharynx is clear.  Eyes:     Conjunctiva/sclera: Conjunctivae normal.     Pupils: Pupils are equal, round, and reactive to light.  Cardiovascular:     Rate and Rhythm: Normal rate.     Comments: DP pulse is intact Pulmonary:     Effort: Pulmonary effort is normal. No respiratory distress.  Abdominal:     General: Abdomen is flat. There is no distension.  Musculoskeletal:        General: Tenderness (left anterior hip) present. No swelling or deformity. Normal range of motion.     Cervical back: Normal range of motion.     Comments: Patient without any hip pain w/ passive ROM. Active ROM does bother him quite a bit though.   Skin:    General: Skin is warm and dry.  Neurological:     General: No focal deficit present.     Mental Status: He is alert.    ED Results /  Procedures / Treatments   Labs (all labs ordered are listed, but only abnormal results are displayed) Labs Reviewed - No data to display  EKG None  Radiology DG Hip Unilat W or Wo Pelvis 2-3 Views Left  Result Date: 02/03/2021 CLINICAL DATA:  Left anterolateral hip pain after walking yesterday. No specific injury. EXAM: DG HIP (WITH OR WITHOUT PELVIS) 2-3V LEFT COMPARISON:  Renal stone CT 01/06/2019 FINDINGS: There is no evidence of hip fracture or dislocation. There is no evidence of arthropathy or other focal bone abnormality. IMPRESSION: Negative. Electronically Signed   By: Marnee Spring M.D.   On: 02/03/2021 05:00    Procedures Procedures   Medications Ordered in ED Medications  ketorolac (TORADOL) injection 30 mg (30 mg Intramuscular Given 02/03/21 0457)  methocarbamol (ROBAXIN) tablet 500 mg (500 mg Oral Given 02/03/21 0455)    ED Course  I have reviewed the triage vital signs and the nursing notes.  Pertinent labs & imaging results that were available during my care of the patient were reviewed by me and considered in my medical decision making (see chart for details).    MDM Rules/Calculators/A&P                         Symptoms seem a muscular ligamentous in nature secondary to the pain with active range of motion no passive range of motion.  I think it is low likelihood to be active septic hip, arthritis, fracture, arterial occlusion or other emergent causes. .  I will prescribe some supportive/symptomatic care and follow-up with PCP for MRI if not improving.    Final Clinical Impression(s) / ED Diagnoses Final diagnoses:  Acute pain of left hip    Rx / DC Orders ED Discharge Orders          Ordered    methocarbamol (ROBAXIN) 500 MG tablet  Every 8 hours PRN        02/03/21 0552    AMB referral to orthopedics        02/03/21 0552             Adin Laker, Barbara Cower, MD 02/04/21 [phone]

## 2021-02-05 DIAGNOSIS — M5416 Radiculopathy, lumbar region: Secondary | ICD-10-CM | POA: Insufficient documentation

## 2021-02-07 ENCOUNTER — Ambulatory Visit
Admission: EM | Admit: 2021-02-07 | Discharge: 2021-02-07 | Disposition: A | Discharge status: Home / Self Care | Payer: 59 | Attending: Internal Medicine | Admitting: Internal Medicine

## 2021-02-07 ENCOUNTER — Other Ambulatory Visit: Payer: Self-pay

## 2021-02-07 DIAGNOSIS — M545 Low back pain, unspecified: Secondary | ICD-10-CM

## 2021-02-07 DIAGNOSIS — R030 Elevated blood-pressure reading, without diagnosis of hypertension: Secondary | ICD-10-CM | POA: Diagnosis not present

## 2021-02-07 MED ORDER — KETOROLAC TROMETHAMINE 30 MG/ML IJ SOLN: 15.0000 mg | Freq: Once | INTRAMUSCULAR | Status: DC

## 2021-02-07 MED ORDER — KETOROLAC TROMETHAMINE 30 MG/ML IJ SOLN
15.0000 mg | Freq: Once | INTRAMUSCULAR | Status: AC
  Administered 2021-02-07: 15 mg via INTRAMUSCULAR

## 2021-02-07 NOTE — Discharge Instructions (Addendum)
Gentle range of motion exercises Heating pad use as we discussed will help with back pain Take medications as prescribed by Dr. Ophelia Charter Follow-up with Dr. Ophelia Charter as planned.

## 2021-02-07 NOTE — ED Triage Notes (Signed)
Pt presents with c/o low back pain for past few days, has been taking prednisone since Thursday, had imaging with Dr Ophelia Charter and was told he has spinal stenosis

## 2021-02-07 NOTE — ED Provider Notes (Signed)
[facility]    CSN: [id number] Arrival date & time: 02/07/21  3976      History   Chief Complaint Chief Complaint  Patient presents with   Back Pain    HPI Clayton Mendoza is a 65 y.o. male comes to the urgent care with back pain.  Patient was recently seen in the orthopedic surgery's office for back pain.  He was prescribed prednisone.  He is on day #3 of prednisone.  The pain at rest is about 2-3 out of 10 but peaks to about 7 or 8 when he is moving around.  No weakness in the lower extremities.  No numbness or tingling.  Pain radiates to the right thigh.  No bowel or bladder problems.  Patient has taken Advil as needed for worsening pain.  HPI  Past Medical History:  Diagnosis Date   Anemia    Arrhythmia    Depression    Heart murmur    Migraine    Renal disorder    Seizures [facility])     Patient Active Problem List   Diagnosis Date Noted   Left lumbar radiculitis 02/05/2021    Past Surgical History:  Procedure Laterality Date   CARDIAC CATHETERIZATION         Home Medications    Prior to Admission medications   Medication Sig Start Date End Date Taking? Authorizing Provider  omeprazole (PRILOSEC) 20 MG capsule TAKE 1 CAPSULE BY MOUTH EVERY DAY 10/11/19   Deeann Saint, MD  predniSONE (STERAPRED UNI-PAK 21 TAB) 10 MG (21) TBPK tablet Take by mouth daily. Take 3 tablets daily with food for 7 days 02/04/21   Eldred Manges, MD    Family History Family History  Problem Relation Age of Onset   Atrial fibrillation Mother    Heart disease Mother    Cancer Father        prostate   Dementia Father     Social History Social History   Tobacco Use   Smoking status: Never   Smokeless tobacco: Never  Substance Use Topics   Alcohol use: Yes    Alcohol/week: 5.0 standard drinks    Types: 5 Standard drinks or equivalent per week    Comment: wine with dinner   Drug use: No     Allergies   Bee venom and Gramineae pollens   Review of  Systems Review of Systems  Constitutional: Negative.   Gastrointestinal: Negative.  Negative for abdominal pain, nausea and vomiting.  Musculoskeletal:  Positive for back pain. Negative for neck pain and neck stiffness.    Physical Exam Triage Vital Signs ED Triage Vitals  Enc Vitals Group     BP 02/07/21 1023 (!) 160/73     Pulse Rate 02/07/21 1023 79     Resp 02/07/21 1023 20     Temp 02/07/21 1023 98.7 F (37.1 C)     Temp src --      SpO2 02/07/21 1023 95 %     Weight --      Height --      Head Circumference --      Peak Flow --      Pain Score 02/07/21 1022 7     Pain Loc --      Pain Edu? --      Excl. in GC? --    No data found.  Updated Vital Signs BP (!) 160/73   Pulse 79   Temp 98.7 F (37.1 C)  Resp 20   SpO2 95%   Visual Acuity Right Eye Distance:   Left Eye Distance:   Bilateral Distance:    Right Eye Near:   Left Eye Near:    Bilateral Near:     Physical Exam Vitals and nursing note reviewed.  Constitutional:      General: He is not in acute distress.    Appearance: He is not ill-appearing.  Cardiovascular:     Rate and Rhythm: Normal rate and regular rhythm.  Abdominal:     General: Bowel sounds are normal.     Palpations: Abdomen is soft.  Musculoskeletal:        General: Tenderness present. No swelling or signs of injury. Normal range of motion.  Skin:    General: Skin is warm and dry.  Neurological:     Mental Status: He is alert.     UC Treatments / Results  Labs (all labs ordered are listed, but only abnormal results are displayed) Labs Reviewed - No data to display  EKG   Radiology No results found.  Procedures Procedures (including critical care time)  Medications Ordered in UC Medications  ketorolac (TORADOL) 30 MG/ML injection 15 mg (15 mg Intramuscular Given 02/07/21 1108)    Initial Impression / Assessment and Plan / UC Course  I have reviewed the triage vital signs and the nursing notes.  Pertinent labs  & imaging results that were available during my care of the patient were reviewed by me and considered in my medical decision making (see chart for details).     1.  Low back pain: Toradol 15 mg x 1 dose Continue prednisone use Continue taking omeprazole If pain does not improve please follow-up with Dr. Ophelia Charter for further management. Return precautions given. Final Clinical Impressions(s) / UC Diagnoses   Final diagnoses:  Low back pain without sciatica, unspecified back pain laterality, unspecified chronicity     Discharge Instructions      Gentle range of motion exercises Heating pad use as we discussed will help with back pain Take medications as prescribed by Dr. Ophelia Charter Follow-up with Dr. Ophelia Charter as planned.   ED Prescriptions   None    PDMP not reviewed this encounter.   Merrilee Jansky, MD 02/07/21 [phone]

## 2021-02-09 ENCOUNTER — Telehealth: Payer: Self-pay | Admitting: Orthopaedic Surgery

## 2021-02-09 ENCOUNTER — Other Ambulatory Visit: Payer: Self-pay | Admitting: Physician Assistant

## 2021-02-09 DIAGNOSIS — M5416 Radiculopathy, lumbar region: Secondary | ICD-10-CM

## 2021-02-09 MED ORDER — METHOCARBAMOL 500 MG PO TABS: 500.0000 mg | ORAL_TABLET | Freq: Two times a day (BID) | ORAL | 0 refills | Status: DC | PRN

## 2021-02-09 MED ORDER — HYDROCODONE-ACETAMINOPHEN 5-325 MG PO TABS: 1.0000 | ORAL_TABLET | Freq: Three times a day (TID) | ORAL | 0 refills | Status: DC | PRN

## 2021-02-09 NOTE — Telephone Encounter (Signed)
Pt called requesting for Betsy to call [facility] at [phone]. For release of hydrocodone medication. Pt states they stated needed call from Dr's office for verification. Pt also is requesting a call from [facility] when she has called pharmacy and medication is ok for pick up. Pt phone number is [phone].

## 2021-02-09 NOTE — Telephone Encounter (Signed)
I called patient and advised we do not do over the phone appts. I also explained that Dr. Ophelia Charter was going to have me order MRI if he continued to have problems. MRI order entered. Patient can manage if he stays in the bed, however, the pain gets severe if he is up to do anything, even if it is showering. He is having a hard time managing the pain.  Can you advise on pain meds since Dr. Ophelia Charter is out of the office? MRI has been ordered.

## 2021-02-09 NOTE — Telephone Encounter (Signed)
resent

## 2021-02-09 NOTE — Telephone Encounter (Signed)
I called pharmacy, closed for lunch.

## 2021-02-09 NOTE — Telephone Encounter (Signed)
Can you please resend the meds to [facility] [place]? I had changed the pharmacy to that, however, it looks like it reverted back [facility] [place]. Thanks.

## 2021-02-09 NOTE — Telephone Encounter (Signed)
Looks like he is already on prednisone.  I sent in norco and robaxin

## 2021-02-09 NOTE — Telephone Encounter (Signed)
I spoke with pharmacy. They have medications ready for patient to pick up. I called patient back and advised.

## 2021-02-09 NOTE — Telephone Encounter (Signed)
Pt called requesting an over the phone consult for his pains. Pt is a pt of Dr. Ophelia Charter in [place] and was told to call here and ask for an over the phone appt possibly with Dr. Cleophas Dunker. Please call pt bout this matter at [phone].

## 2021-02-09 NOTE — Telephone Encounter (Signed)
noted 

## 2021-02-17 ENCOUNTER — Other Ambulatory Visit: Payer: Self-pay | Admitting: Orthopaedic Surgery

## 2021-02-17 ENCOUNTER — Telehealth: Payer: Self-pay | Admitting: Orthopaedic Surgery

## 2021-02-17 MED ORDER — METHOCARBAMOL 500 MG PO TABS: 500.0000 mg | ORAL_TABLET | Freq: Two times a day (BID) | ORAL | 0 refills | Status: DC | PRN

## 2021-02-17 NOTE — Telephone Encounter (Signed)
Pt calling stating he just needs to get the refill on his prescription of Hydrocodone and Methocarbamol. The best pharmacy is the one on file and the best call back number is [phone].

## 2021-02-17 NOTE — Telephone Encounter (Signed)
Please advise. Clayton Mendoza had sent in rx for patient when you were out of the office. He states that he will be out of the medications on Friday. Thanks.

## 2021-02-17 NOTE — Telephone Encounter (Signed)
Pt called asking for a call back from [place]. Pt did not disclose the reasoning for the call. Pt phone number is [phone].

## 2021-02-17 NOTE — Telephone Encounter (Signed)
I left voicemail for patient requesting return call. 

## 2021-02-19 ENCOUNTER — Other Ambulatory Visit: Payer: Self-pay

## 2021-02-19 ENCOUNTER — Ambulatory Visit (HOSPITAL_COMMUNITY)
Admission: RE | Admit: 2021-02-19 | Discharge: 2021-02-19 | Disposition: A | Discharge status: Home / Self Care | Payer: 59 | Source: Ambulatory Visit | Attending: Orthopaedic Surgery | Admitting: Orthopaedic Surgery

## 2021-02-19 DIAGNOSIS — M5416 Radiculopathy, lumbar region: Secondary | ICD-10-CM | POA: Diagnosis present

## 2021-02-19 IMAGING — MR MR LUMBAR SPINE W/O CM
5 series · 31 of 48 positions shown · non-contrast
Comparison: None.

CLINICAL DATA: Low back pain radiating down left leg

EXAM:
MRI LUMBAR SPINE WITHOUT CONTRAST
TECHNIQUE: Multiplanar, multisequence MR imaging of the lumbar spine was
performed. No intravenous contrast was administered.

[Series 5: T2 · sagittal · 4.0mm · 0.68mm/px · 8 of 16 slices shown (1 of 2)]
[im 1/16]
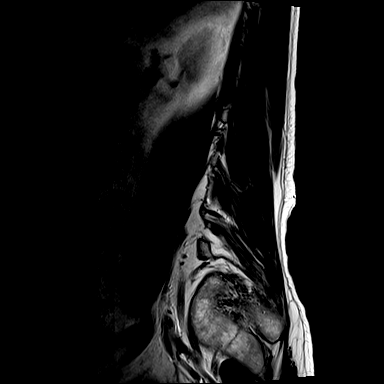
[im 3/16]
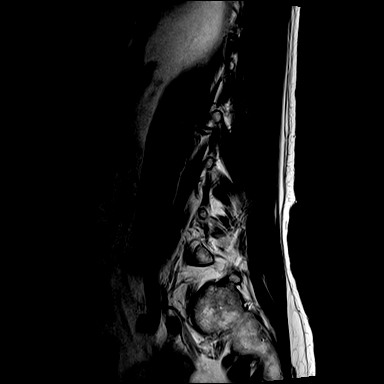
[im 5/16]
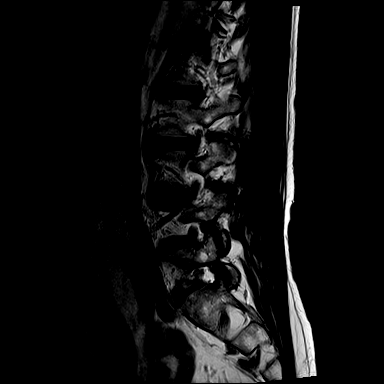
[im 7/16]
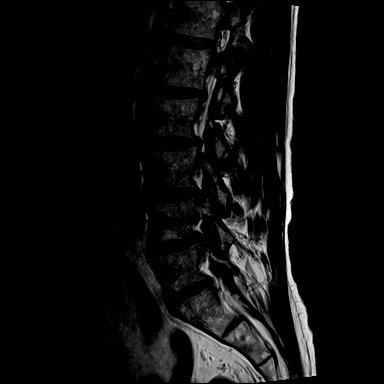
[im 9/16]
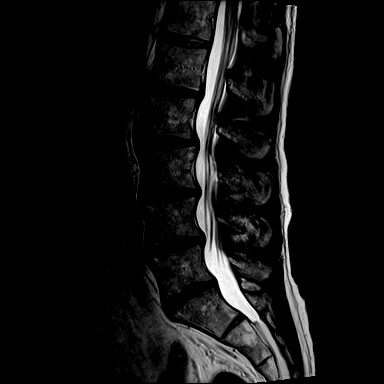
[im 11/16]
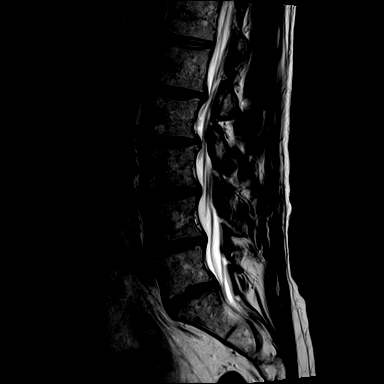
[im 13/16]
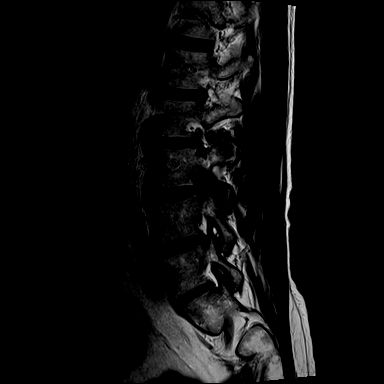
[im 16/16]
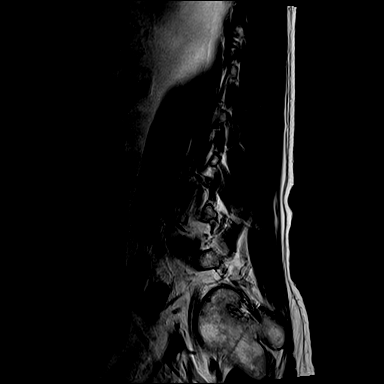

[Series 6: T1 · sagittal · 4.0mm · 0.81mm/px · 6 of 15 slices shown (1 of 2)]
[im 1/15]
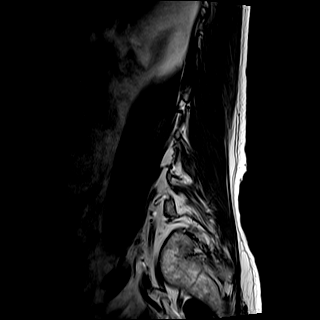
[im 3/15]
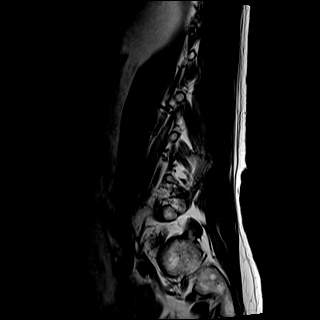
[im 6/15]
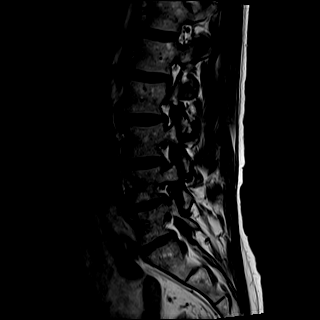
[im 9/15]
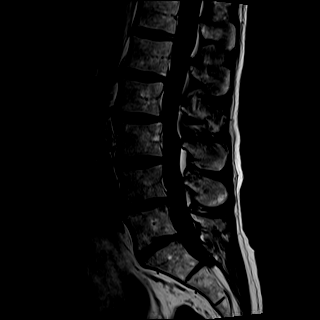
[im 12/15]
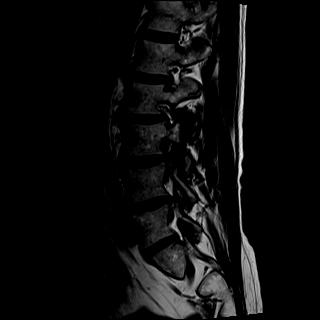
[im 15/15]
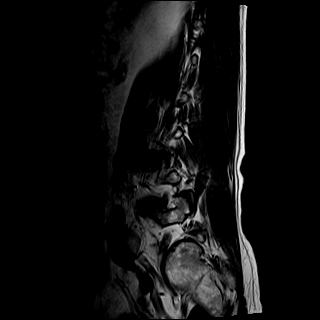

[Series 7: STIR · sagittal · 4.0mm · 0.51mm/px · 1 of 15 slices shown]
[im 1/15]
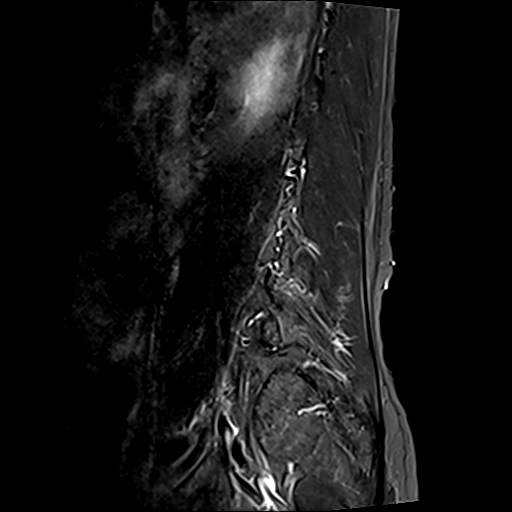

[Series 8: T2 · axial · 4.0mm · 0.70mm/px · z∈[-152,+42]mm · 8 of 33 slices shown (2 of 2)]
[im 1/33]
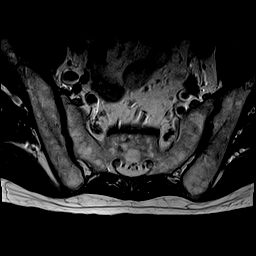
[im 5/33]
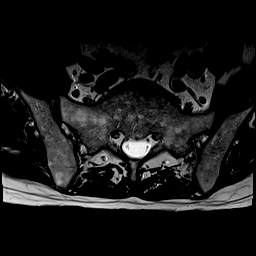
[im 10/33]
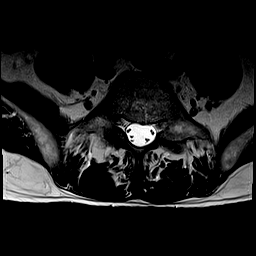
[im 15/33]
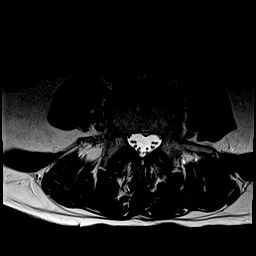
[im 18/33]
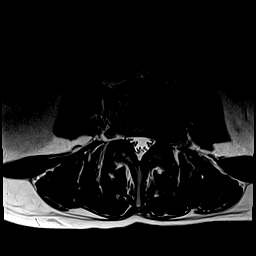
[im 23/33]
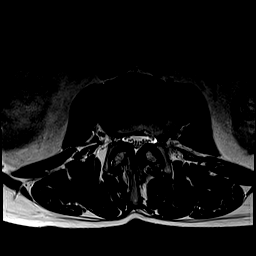
[im 28/33]
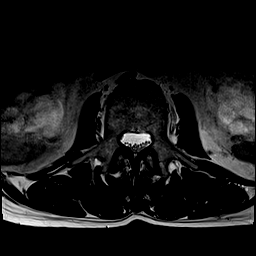
[im 33/33]
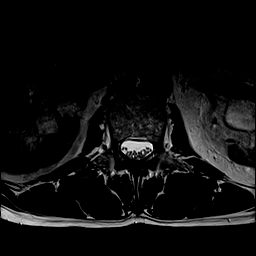

[Series 9: T1 · axial · 4.0mm · 0.35mm/px · z∈[-152,+42]mm · 8 of 33 slices shown (2 of 2)]
[im 1/33]
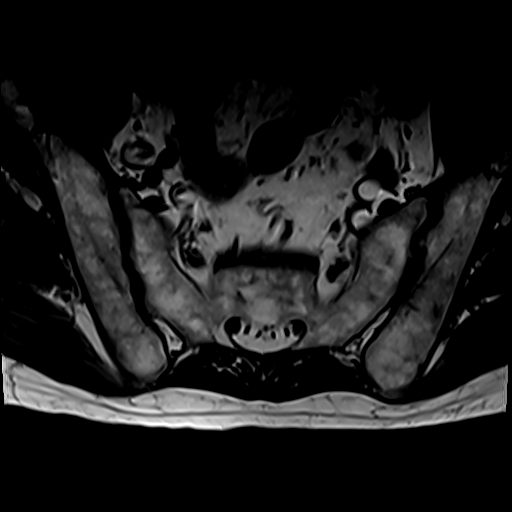
[im 5/33]
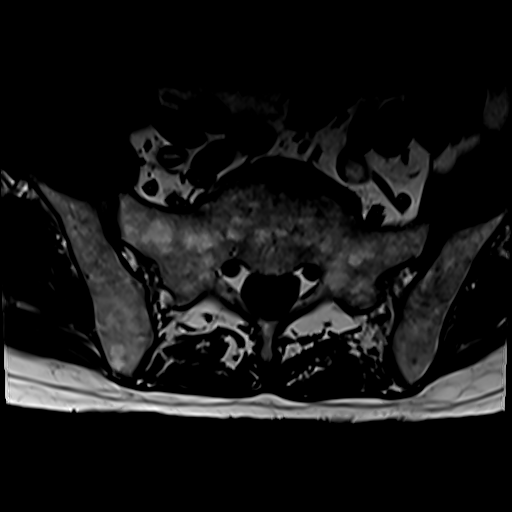
[im 10/33]
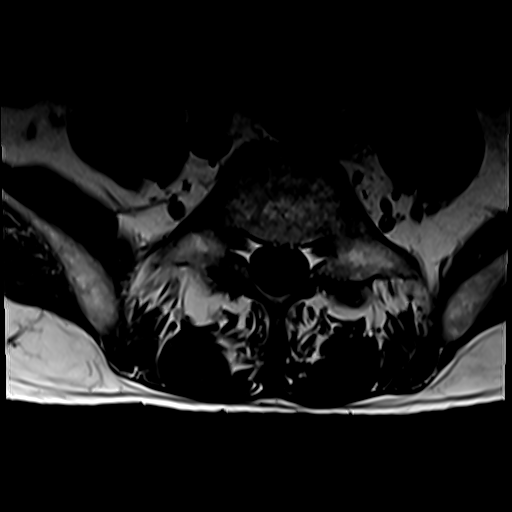
[im 15/33]
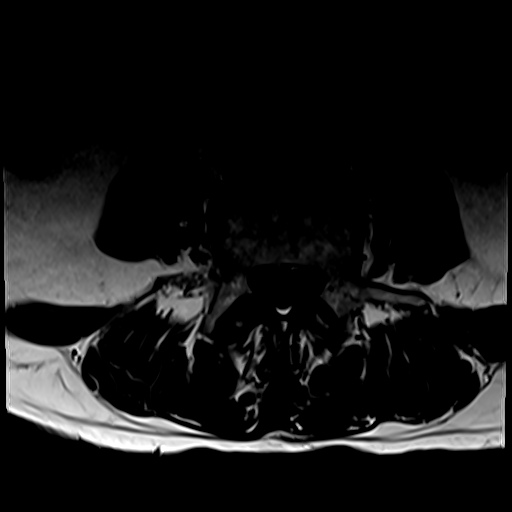
[im 18/33]
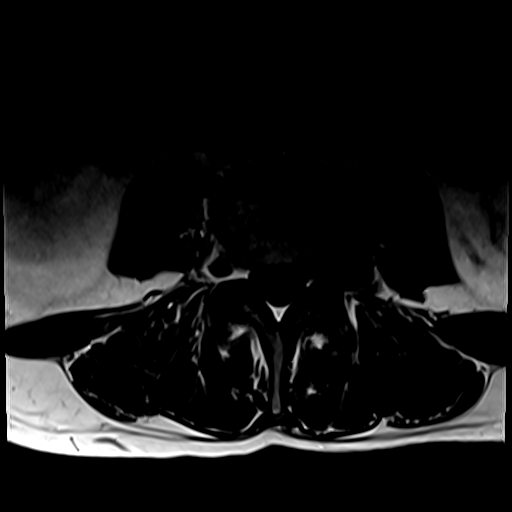
[im 23/33]
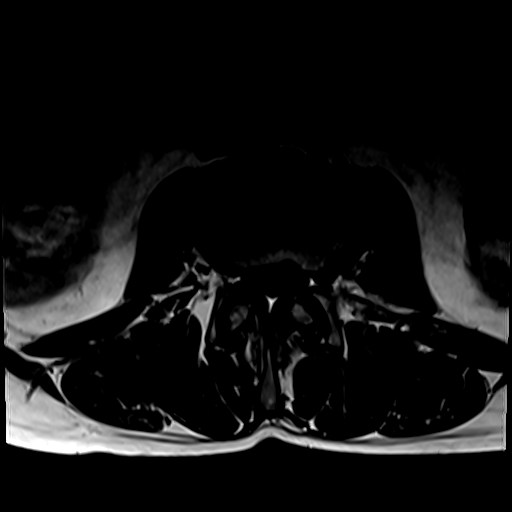
[im 28/33]
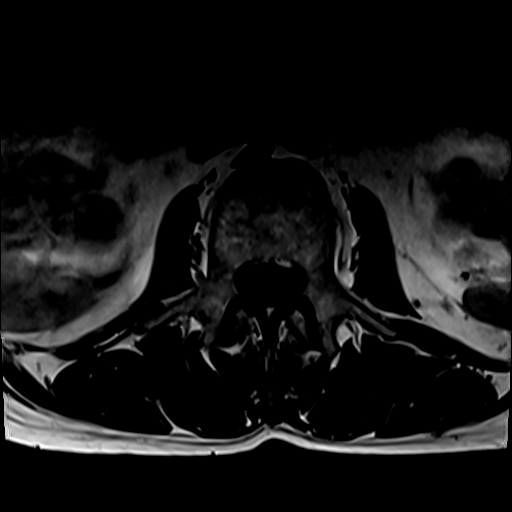
[im 33/33]
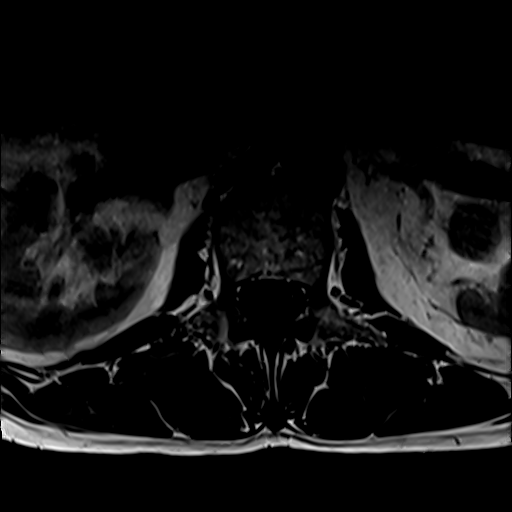

[31 of 48 positions shown; findings below may reference images not displayed]

FINDINGS: Segmentation:  Standard.

Alignment:  Trace anterolisthesis at L2-L3. Levocurvature.

Vertebrae: Vertebral body heights are maintained. No marrow edema.
No suspicious osseous lesion.

Conus medullaris and cauda equina: Conus extends to the T12 level.
Conus and cauda equina appear normal.

Paraspinal and other soft tissues: Unremarkable

Disc levels:

L1-L2: Disc bulge with small left central annular fissure. Mild
facet arthropathy. No canal or foraminal stenosis.

L2-L3: Anterolisthesis with uncovering of disc bulge. Endplate
osteophytic ridging eccentric to the left. Moderate facet
arthropathy with ligamentum flavum infolding. Mild canal stenosis.
Partial effacement of the right greater than left subarticular
recesses. Mild right and mild to moderate left foraminal stenosis.
Disc may compress the extraforaminal left L2 nerve root.

L3-L4: Disc bulge with superimposed left foraminal/far lateral
extrusion and annular fissure. Mild facet arthropathy with
ligamentum flavum infolding. Minor canal stenosis. Slight effacement
of subarticular recesses. No right foraminal stenosis. Marked left
foraminal stenosis with compression of exiting L3 nerve roots.

L4-L5: Disc bulge eccentric to the right with endplate osteophytic
ridging. Mild facet arthropathy with ligamentum flavum infolding. No
canal stenosis. Mild foraminal stenosis.

L5-S1:  Mild facet arthropathy.  No canal or foraminal stenosis.
IMPRESSION: Multilevel degenerative changes as detailed above. Most notably, a
disc herniation at L3-L4 compresses the left L3 nerve root. Possible
left L2 nerve root compression at L2-L3.

## 2021-02-25 ENCOUNTER — Other Ambulatory Visit: Payer: Self-pay

## 2021-02-25 ENCOUNTER — Ambulatory Visit (INDEPENDENT_AMBULATORY_CARE_PROVIDER_SITE_OTHER): Payer: 59 | Admitting: Orthopaedic Surgery

## 2021-02-25 DIAGNOSIS — M48061 Spinal stenosis, lumbar region without neurogenic claudication: Secondary | ICD-10-CM | POA: Diagnosis not present

## 2021-02-25 DIAGNOSIS — M5126 Other intervertebral disc displacement, lumbar region: Secondary | ICD-10-CM

## 2021-02-25 NOTE — Progress Notes (Signed)
Office Visit Note   Patient: Clayton Mendoza           Date of Birth: 23-Aug-1955           MRN: [id number] Visit Date: 02/25/2021              Requested by: Eliezer Lofts, MD [address] [place],  [place] [address] PCP: Eliezer Lofts, MD   Assessment & Plan: Visit Diagnoses:  1. Lumbar foraminal stenosis   2. Lumbar disc herniation     Plan: Patient is actually doing better we discussed epidural foraminal on the left with his foraminal HNP large.  He is actually getting a little bit better each week and he will call if he like to proceed with the epidural.  Recheck 4 weeks.  Again with copy of the report we reviewed the images pathophysiology discussed in detail.  Follow-Up Instructions: Return in about 4 weeks (around 03/25/2021).   Orders:  No orders of the defined types were placed in this encounter.  No orders of the defined types were placed in this encounter.     Procedures: No procedures performed   Clinical Data: No additional findings.   Subjective: Chief Complaint  Patient presents with   Lower Back - Follow-up    MRI lumbar review    HPI 65 year old male returns post MRI scan.  He continues to have back and left leg pain into his hip but states is actually gotten a little bit better since his MRI.  MRI scan shows disc herniation large with large foraminal extrusion with annular fissure.  He has mild central stenosis but severe left foraminal stenosis and only mild foraminal narrowing.  Patient is amatory with a cane pain is been worse at night.  Review of Systems no chills or fever no bowel bladder symptoms.  He gets relief with sitting he has problems at night trying to sleep after being active during the day.   Objective: Vital Signs: Ht 6' (1.829 m)   Wt 170 lb (77.1 kg)   BMI 23.06 kg/m   Physical Exam Constitutional:      Appearance: He is well-developed.  HENT:     Head: Normocephalic and atraumatic.     Right Ear: External ear  normal.     Left Ear: External ear normal.  Eyes:     Pupils: Pupils are equal, round, and reactive to light.  Neck:     Thyroid: No thyromegaly.     Trachea: No tracheal deviation.  Cardiovascular:     Rate and Rhythm: Normal rate.  Pulmonary:     Effort: Pulmonary effort is normal.     Breath sounds: No wheezing.  Abdominal:     General: Bowel sounds are normal.     Palpations: Abdomen is soft.  Musculoskeletal:     Cervical back: Neck supple.  Skin:    General: Skin is warm and dry.     Capillary Refill: Capillary refill takes less than 2 seconds.  Neurological:     Mental Status: He is alert and oriented to person, place, and time.  Psychiatric:        Behavior: Behavior normal.        Thought Content: Thought content normal.        Judgment: Judgment normal.    Ortho Exam patient is ambulatory with a cane with left lower extremity limp.  Some tenderness behind the trochanter.  Moving better than 3 weeks ago.  Specialty Comments:  No  specialty comments available.  Imaging: CLINICAL DATA:  Low back pain radiating down left leg   EXAM: MRI LUMBAR SPINE WITHOUT CONTRAST   TECHNIQUE: Multiplanar, multisequence MR imaging of the lumbar spine was performed. No intravenous contrast was administered.   COMPARISON:  None.   FINDINGS: Segmentation:  Standard.   Alignment:  Trace anterolisthesis at L2-L3. Levocurvature.   Vertebrae: Vertebral body heights are maintained. No marrow edema. No suspicious osseous lesion.   Conus medullaris and cauda equina: Conus extends to the T12 level. Conus and cauda equina appear normal.   Paraspinal and other soft tissues: Unremarkable   Disc levels:   L1-L2: Disc bulge with small left central annular fissure. Mild facet arthropathy. No canal or foraminal stenosis.   L2-L3: Anterolisthesis with uncovering of disc bulge. Endplate osteophytic ridging eccentric to the left. Moderate facet arthropathy with ligamentum flavum  infolding. Mild canal stenosis. Partial effacement of the right greater than left subarticular recesses. Mild right and mild to moderate left foraminal stenosis. Disc may compress the extraforaminal left L2 nerve root.   L3-L4: Disc bulge with superimposed left foraminal/far lateral extrusion and annular fissure. Mild facet arthropathy with ligamentum flavum infolding. Minor canal stenosis. Slight effacement of subarticular recesses. No right foraminal stenosis. Marked left foraminal stenosis with compression of exiting L3 nerve roots.   L4-L5: Disc bulge eccentric to the right with endplate osteophytic ridging. Mild facet arthropathy with ligamentum flavum infolding. No canal stenosis. Mild foraminal stenosis.   L5-S1:  Mild facet arthropathy.  No canal or foraminal stenosis.   IMPRESSION: Multilevel degenerative changes as detailed above. Most notably, a disc herniation at L3-L4 compresses the left L3 nerve root. Possible left L2 nerve root compression at L2-L3.     Electronically Signed   By: Guadlupe Spanish M.D.   On: 02/19/2021 20:40     PMFS History: Patient Active Problem List   Diagnosis Date Noted   Lumbar foraminal stenosis 02/25/2021   Lumbar disc herniation 02/25/2021   Left lumbar radiculitis 02/05/2021   Past Medical History:  Diagnosis Date   Anemia    Arrhythmia    Depression    Heart murmur    Migraine    Renal disorder    Seizures (HCC)     Family History  Problem Relation Age of Onset   Atrial fibrillation Mother    Heart disease Mother    Cancer Father        prostate   Dementia Father     Past Surgical History:  Procedure Laterality Date   CARDIAC CATHETERIZATION     Social History   Occupational History   Occupation: Attorney  Tobacco Use   Smoking status: Never   Smokeless tobacco: Never  Substance and Sexual Activity   Alcohol use: Yes    Alcohol/week: 5.0 standard drinks    Types: 5 Standard drinks or equivalent per week     Comment: wine with dinner   Drug use: No   Sexual activity: Not on file

## 2021-03-12 ENCOUNTER — Other Ambulatory Visit: Payer: Self-pay | Admitting: Orthopaedic Surgery

## 2021-03-12 MED ORDER — METHOCARBAMOL 500 MG PO TABS: 500.0000 mg | ORAL_TABLET | Freq: Two times a day (BID) | ORAL | 0 refills | Status: DC | PRN

## 2021-03-12 NOTE — Telephone Encounter (Signed)
Please advise 

## 2021-04-01 ENCOUNTER — Ambulatory Visit (INDEPENDENT_AMBULATORY_CARE_PROVIDER_SITE_OTHER): Payer: 59 | Admitting: Orthopaedic Surgery

## 2021-04-01 ENCOUNTER — Other Ambulatory Visit: Payer: Self-pay

## 2021-04-01 DIAGNOSIS — M5126 Other intervertebral disc displacement, lumbar region: Secondary | ICD-10-CM | POA: Diagnosis not present

## 2021-04-01 DIAGNOSIS — M48061 Spinal stenosis, lumbar region without neurogenic claudication: Secondary | ICD-10-CM

## 2021-04-01 NOTE — Progress Notes (Signed)
Office Visit Note   Patient: Clayton Mendoza           Date of Birth: June 02, 1956           MRN: [id number] Visit Date: 04/01/2021              Requested by: Eliezer Lofts, MD [address] [place],  [place] [address] PCP: Eliezer Lofts, MD   Assessment & Plan: Visit Diagnoses:  1. Lumbar disc herniation   2. Lumbar foraminal stenosis     Plan: Left foraminal disc herniation L3-4 compressing the left L3 nerve root is improved significantly.  I will check her back again on an as-needed basis he can gradually resume regular activity and call if he has recurrence of symptoms.  We discussed the short pedicles that are present and some narrowing that he has at L2-3 with trace anterolisthesis at that level.  Follow-Up Instructions: Return if symptoms worsen or fail to improve.   Orders:  No orders of the defined types were placed in this encounter.  No orders of the defined types were placed in this encounter.     Procedures: No procedures performed   Clinical Data: No additional findings.   Subjective: Chief Complaint  Patient presents with   Lower Back - Pain    HPI 65 year old male returns post foraminal HNP large left L3-4 extrusion.  He states his pain is actually getting better every week.  He is off all pain medication only has a dull ache.  He has noticed some mild swelling lower extremity did have a prednisone Dosepak.  Still has a little bit of weakness in his leg and asked about resuming stair climbing which he does for exercise.  Review of Systems unchanged from 02/25/2021.   Objective: Vital Signs: There were no vitals taken for this visit.  Physical Exam Constitutional:      Appearance: He is well-developed.  HENT:     Head: Normocephalic and atraumatic.     Right Ear: External ear normal.     Left Ear: External ear normal.  Eyes:     Pupils: Pupils are equal, round, and reactive to light.  Neck:     Thyroid: No thyromegaly.     Trachea:  No tracheal deviation.  Cardiovascular:     Rate and Rhythm: Normal rate.  Pulmonary:     Effort: Pulmonary effort is normal.     Breath sounds: No wheezing.  Abdominal:     General: Bowel sounds are normal.     Palpations: Abdomen is soft.  Musculoskeletal:     Cervical back: Neck supple.  Skin:    General: Skin is warm and dry.     Capillary Refill: Capillary refill takes less than 2 seconds.  Neurological:     Mental Status: He is alert and oriented to person, place, and time.  Psychiatric:        Behavior: Behavior normal.        Thought Content: Thought content normal.        Judgment: Judgment normal.    Ortho Exam patient is amatory no longer using a cane no limp.  Mild tenderness to the trochanter.  He gets from sitting standing comfortably.  Specialty Comments:  No specialty comments available.  Imaging: No results found.   PMFS History: Patient Active Problem List   Diagnosis Date Noted   Lumbar foraminal stenosis 02/25/2021   Lumbar disc herniation 02/25/2021   Left lumbar radiculitis 02/05/2021   Past  Medical History:  Diagnosis Date   Anemia    Arrhythmia    Depression    Heart murmur    Migraine    Renal disorder    Seizures (HCC)     Family History  Problem Relation Age of Onset   Atrial fibrillation Mother    Heart disease Mother    Cancer Father        prostate   Dementia Father     Past Surgical History:  Procedure Laterality Date   CARDIAC CATHETERIZATION     Social History   Occupational History   Occupation: Attorney  Tobacco Use   Smoking status: Never   Smokeless tobacco: Never  Substance and Sexual Activity   Alcohol use: Yes    Alcohol/week: 5.0 standard drinks    Types: 5 Standard drinks or equivalent per week    Comment: wine with dinner   Drug use: No   Sexual activity: Not on file

## 2021-07-19 ENCOUNTER — Other Ambulatory Visit: Payer: Self-pay | Admitting: Specialist

## 2021-07-19 DIAGNOSIS — G43519 Persistent migraine aura without cerebral infarction, intractable, without status migrainosus: Secondary | ICD-10-CM

## 2021-08-05 ENCOUNTER — Ambulatory Visit
Admission: RE | Admit: 2021-08-05 | Discharge: 2021-08-05 | Disposition: A | Discharge status: Home / Self Care | Payer: 59 | Source: Ambulatory Visit | Attending: Specialist | Admitting: Specialist

## 2021-08-05 DIAGNOSIS — G43519 Persistent migraine aura without cerebral infarction, intractable, without status migrainosus: Secondary | ICD-10-CM

## 2021-08-05 IMAGING — MR MR HEAD WO/W CM
12 series · 48 of 48 positions shown · IV contrast (multihance)
Comparison: None.

CLINICAL DATA: Migraine

EXAM:
MRI HEAD WITHOUT AND WITH CONTRAST
TECHNIQUE: Multiplanar, multiecho pulse sequences of the brain and surrounding
structures were obtained without and with intravenous contrast.
CONTRAST:  15mL MULTIHANCE GADOBENATE DIMEGLUMINE 529 MG/ML IV SOLN

[Series 2: T1 · sagittal · 5.0mm · 0.45mm/px · 2 of 24 slices shown]
[im 1/24]
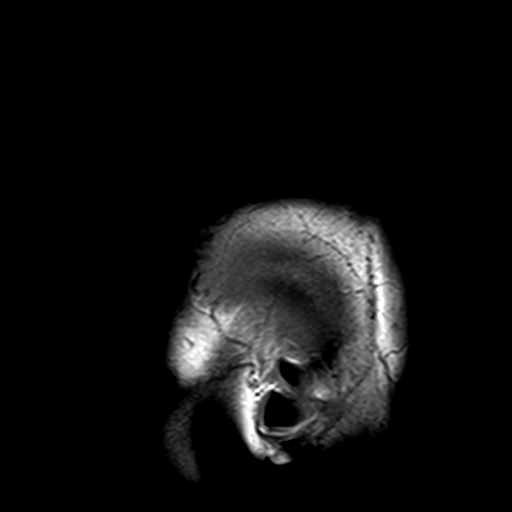
[im 24/24]
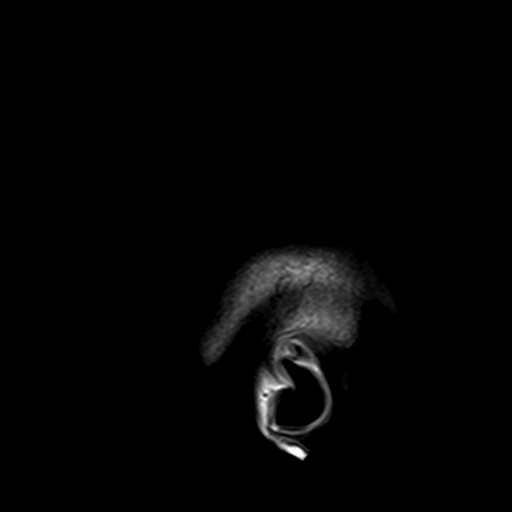

[Series 3: DWI · axial · 3.0mm · 1.80mm/px · z∈[-76,+70]mm · 7 of 104 slices shown (1 of 4)]
[im 1/104]
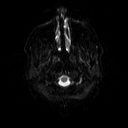
[im 18/104]
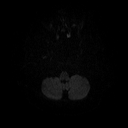
[im 35/104]
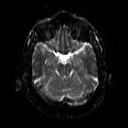
[im 52/104]
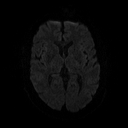
[im 69/104]
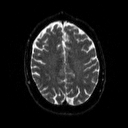
[im 86/104]
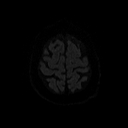
[im 104/104]
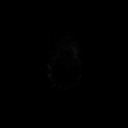

[Series 4: DWI · axial · 3.0mm · 1.80mm/px · z∈[-76,+70]mm · 3 of 49 slices shown (2 of 4)]
[im 1/49]
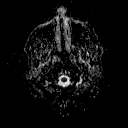
[im 25/49]
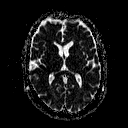
[im 49/49]
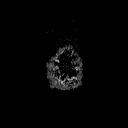

[Series 5: DWI · coronal · 5.0mm · 1.80mm/px · 5 of 78 slices shown (3 of 4)]
[im 1/78]
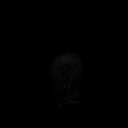
[im 20/78]
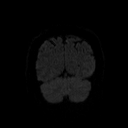
[im 39/78]
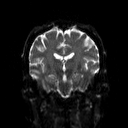
[im 58/78]
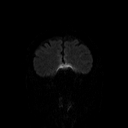
[im 78/78]
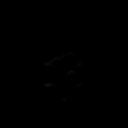

[Series 6: DWI · coronal · 5.0mm · 1.80mm/px · 2 of 39 slices shown (4 of 4)]
[im 1/39]
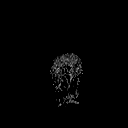
[im 39/39]
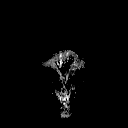

[Series 7: T2 · axial · 5.0mm · 0.60mm/px · 1 of 25 slices shown (1 of 2)]
[im 1/25]
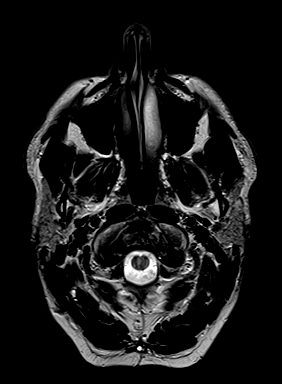

[Series 8: FLAIR · axial · 3.0mm · 0.47mm/px · z∈[-78,+81]mm · 2 of 37 slices shown]
[im 1/37]
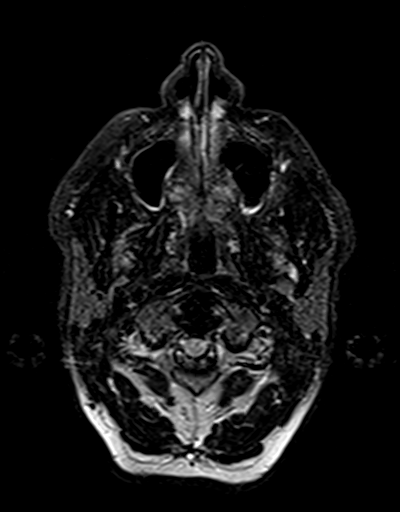
[im 37/37]
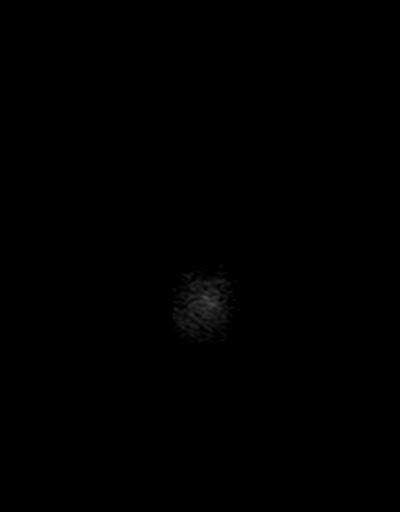

[Series 10: swi_images · axial · 4.0mm · 0.90mm/px · z∈[-73,+75]mm · 2 of 40 slices shown]
[im 1/40]
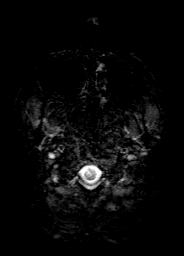
[im 40/40]
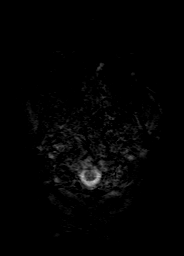

[Series 11: t1_mpr_tra · axial · 1.0mm · 0.78mm/px · z∈[-78,+88]mm · 10 of 176 slices shown]
[im 1/176]
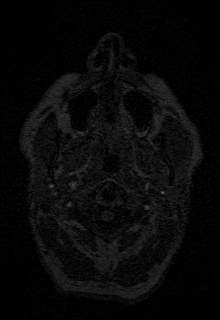
[im 20/176]
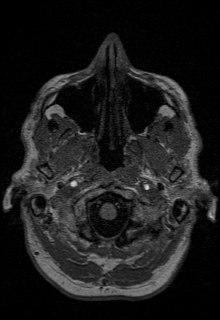
[im 39/176]
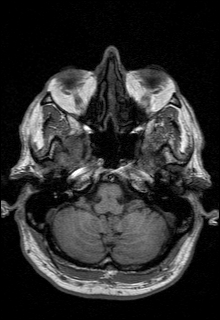
[im 59/176]
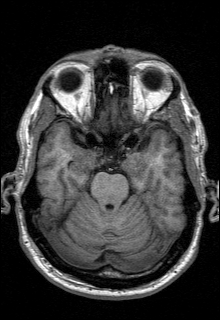
[im 78/176]
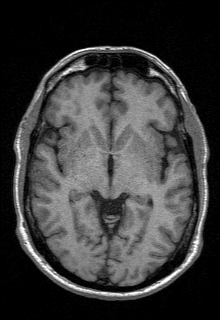
[im 98/176]
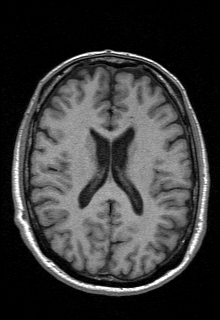
[im 117/176]
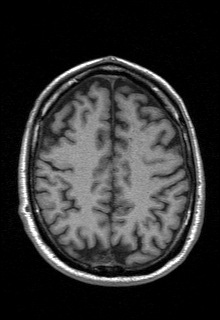
[im 137/176]
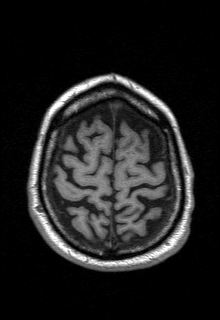
[im 156/176]
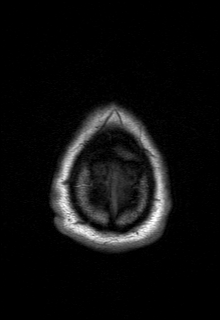
[im 176/176]
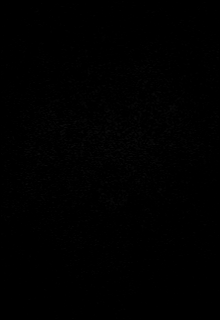

[Series 12: T2 · coronal · 5.0mm · 0.45mm/px · 2 of 31 slices shown (2 of 2)]
[im 1/31]
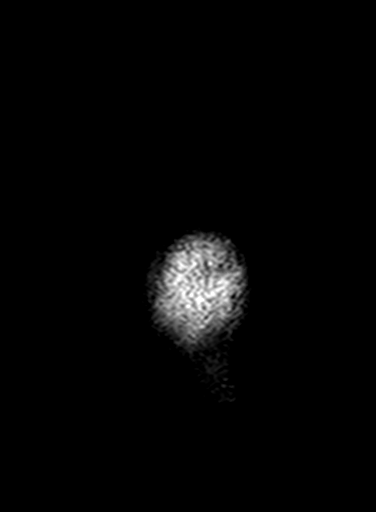
[im 31/31]
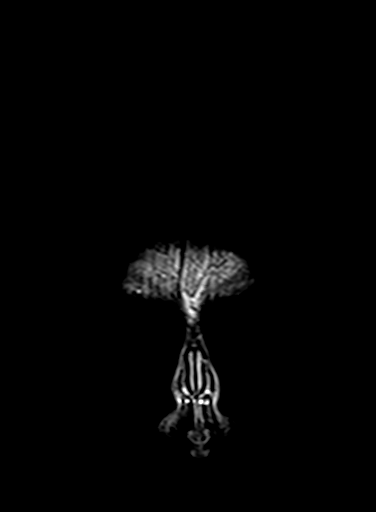

[Series 13: t1_mpr_tra post · axial · 1.0mm · 0.78mm/px · z∈[-78,+88]mm · 10 of 176 slices shown]
[im 1/176]
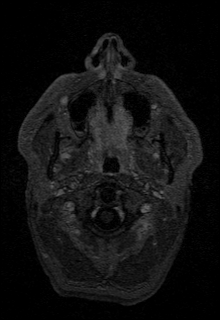
[im 20/176]
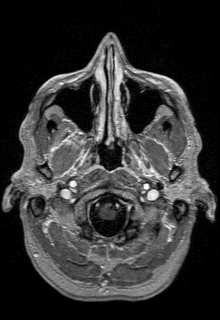
[im 39/176]
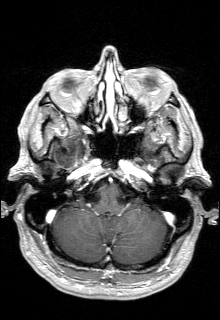
[im 59/176]
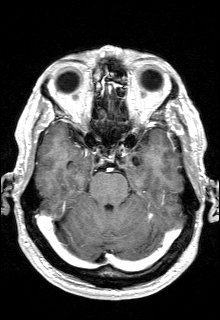
[im 78/176]
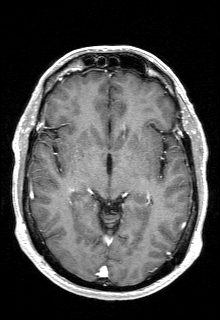
[im 98/176]
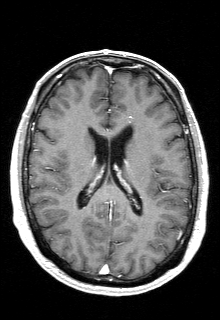
[im 117/176]
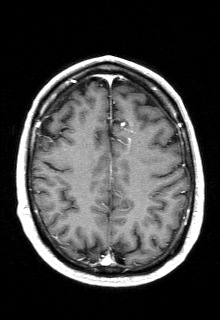
[im 137/176]
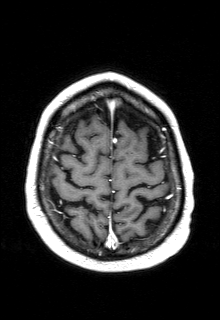
[im 156/176]
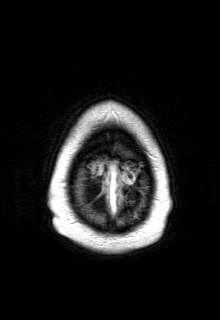
[im 176/176]
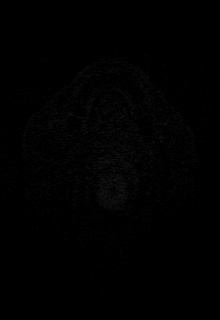

[Series 14: post cor · coronal · 5.0mm · 0.45mm/px · 2 of 31 slices shown]
[im 1/31]
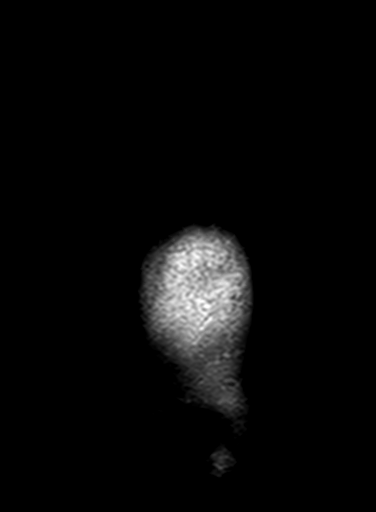
[im 31/31]
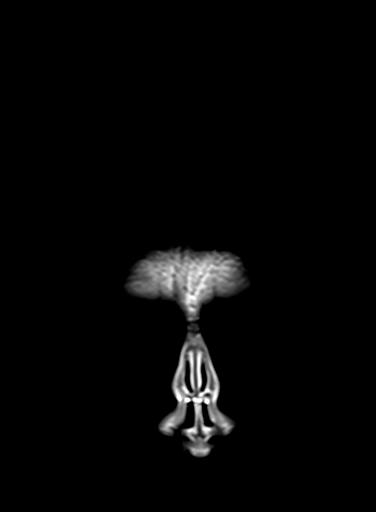

[48 of 48 positions shown; findings below may reference images not displayed]

FINDINGS: Brain: There is no evidence of acute intracranial hemorrhage,
extra-axial fluid collection, or acute infarct.

Parenchymal volume is normal. The ventricles are normal in size.
There is a minimal burden of white matter microangiopathic change.
There is no suspicious parenchymal signal abnormality. Gray-white
differentiation is preserved.

An incidental left frontal lobe developmental venous anomaly is
noted. There is no other abnormal enhancement. There is no mass
lesion. There is no mass effect or midline shift.

Vascular: Normal flow voids.

Skull and upper cervical spine: Normal marrow signal.

Sinuses/Orbits: The imaged paranasal sinuses are clear. The globes
and orbits are unremarkable.

Other: None.
IMPRESSION: No acute intracranial pathology.  Unremarkable for age brain MRI.

## 2021-08-05 MED ORDER — GADOBENATE DIMEGLUMINE 529 MG/ML IV SOLN
15.0000 mL | Freq: Once | INTRAVENOUS | Status: AC | PRN
  Administered 2021-08-05: 15 mL via INTRAVENOUS

## 2021-12-20 ENCOUNTER — Telehealth: Payer: 59 | Admitting: Physician Assistant

## 2021-12-20 NOTE — Progress Notes (Signed)
The patient no-showed for appointment despite this provider sending direct link, reaching out via phone with no response and waiting for at least 10 minutes from appointment time for patient to join. They will be marked as a NS for this appointment/time.  ? ?Clayton Gibbard Cody Alford Gamero, PA-C ? ? ? ?

## 2021-12-21 ENCOUNTER — Encounter (HOSPITAL_COMMUNITY): Payer: Self-pay

## 2021-12-21 ENCOUNTER — Ambulatory Visit (HOSPITAL_COMMUNITY)
Admission: RE | Admit: 2021-12-21 | Discharge: 2021-12-21 | Disposition: A | Discharge status: Home / Self Care | Payer: 59 | Source: Ambulatory Visit | Attending: Family Medicine | Admitting: Family Medicine

## 2021-12-21 VITALS — BP 144/78 | HR 63 | Temp 98.5°F | Resp 16

## 2021-12-21 DIAGNOSIS — L509 Urticaria, unspecified: Secondary | ICD-10-CM | POA: Diagnosis not present

## 2021-12-21 HISTORY — DX: Other intervertebral disc displacement, lumbar region: M51.26

## 2021-12-21 MED ORDER — EPINEPHRINE 0.3 MG/0.3ML IJ SOAJ: 0.3000 mg | INTRAMUSCULAR | 1 refills | Status: AC | PRN

## 2021-12-21 NOTE — ED Triage Notes (Signed)
Patient c/o allergic reaction x 3 days.   Patient denies SOB. Patient denies throat irritation or tightness.   Patient endorses hives that have occurred under both armpits.   Patient endorses reaction occurred due to being stung by a bee.  Patient needs refill of Epi pen.   Patient took Benadryl with some relief of symptoms.

## 2021-12-21 NOTE — ED Provider Notes (Signed)
[facility]    CSN: [id number] Arrival date & time: 12/21/21  1817      History   Chief Complaint Chief Complaint  Patient presents with   Allergic Reaction   APPT     HPI Clayton Mendoza is a 66 y.o. male.   Presents requesting refill for epinephrine injection pen, endorses his is expired and is greater than 84 years old.  Had an allergic reaction to bee stings 3 days ago.  Endorses that he was stung 6 times, 2 times to the right side of neck, 1 to the left side of neck, 1 to the posterior neck and 2 to the posterior right arm.  Initially had hives present to the bilateral armpits into the bilateral groin.  Used Benadryl over the next 24 hours and all symptoms have resolved.  Denies shortness of breath, chest pain or tightness, sore throat, itchy throat or difficulty breathing at time of reaction.    Past Medical History:  Diagnosis Date   Anemia    Arrhythmia    Depression    Heart murmur    Lumbar herniated disc    Migraine    Renal disorder    Seizures [facility])     Patient Active Problem List   Diagnosis Date Noted   Lumbar foraminal stenosis 02/25/2021   Lumbar disc herniation 02/25/2021   Left lumbar radiculitis 02/05/2021    Past Surgical History:  Procedure Laterality Date   CARDIAC CATHETERIZATION         Home Medications    Prior to Admission medications   Medication Sig Start Date End Date Taking? Authorizing Provider  EPINEPHrine (EPIPEN 2-PAK) 0.3 mg/0.3 mL IJ SOAJ injection Inject 0.3 mg into the muscle as needed for anaphylaxis.    [provider]  HYDROcodone-acetaminophen (NORCO) 5-325 MG tablet Take 1 tablet by mouth 3 (three) times daily as needed. 02/09/21   Cristie Hem, PA-C  methocarbamol (ROBAXIN) 500 MG tablet Take 1 tablet (500 mg total) by mouth 2 (two) times daily as needed. 03/12/21   Eldred Manges, MD  omeprazole (PRILOSEC) 20 MG capsule TAKE 1 CAPSULE BY MOUTH EVERY DAY 10/11/19   Deeann Saint, MD  predniSONE  (STERAPRED UNI-PAK 21 TAB) 10 MG (21) TBPK tablet Take by mouth daily. Take 3 tablets daily with food for 7 days 02/04/21   Eldred Manges, MD    Family History Family History  Problem Relation Age of Onset   Atrial fibrillation Mother    Heart disease Mother    Cancer Father        prostate   Dementia Father     Social History Social History   Tobacco Use   Smoking status: Never   Smokeless tobacco: Never  Vaping Use   Vaping Use: Never used  Substance Use Topics   Alcohol use: Yes    Alcohol/week: 5.0 standard drinks of alcohol    Types: 5 Standard drinks or equivalent per week    Comment: wine with dinner   Drug use: No     Allergies   Bee venom and Gramineae pollens   Review of Systems Review of Systems  Constitutional: Negative.   Respiratory: Negative.    Cardiovascular: Negative.   Skin:  Positive for rash. Negative for color change, pallor and wound.  Neurological: Negative.      Physical Exam Triage Vital Signs ED Triage Vitals  Enc Vitals Group     BP 12/21/21 1836 (!) 144/78  Pulse Rate 12/21/21 1836 63     Resp 12/21/21 1836 16     Temp 12/21/21 1836 98.5 F (36.9 C)     Temp Source 12/21/21 1836 Oral     SpO2 12/21/21 1836 95 %     Weight --      Height --      Head Circumference --      Peak Flow --      Pain Score 12/21/21 1840 0     Pain Loc --      Pain Edu? --      Excl. in GC? --    No data found.  Updated Vital Signs BP (!) 144/78 (BP Location: Left Arm)   Pulse 63   Temp 98.5 F (36.9 C) (Oral)   Resp 16   SpO2 95%   Visual Acuity Right Eye Distance:   Left Eye Distance:   Bilateral Distance:    Right Eye Near:   Left Eye Near:    Bilateral Near:     Physical Exam Constitutional:      Appearance: Normal appearance.  HENT:     Head: Normocephalic.  Eyes:     Extraocular Movements: Extraocular movements intact.  Cardiovascular:     Rate and Rhythm: Normal rate and regular rhythm.     Pulses: Normal pulses.      Heart sounds: Normal heart sounds.  Pulmonary:     Effort: Pulmonary effort is normal.     Breath sounds: Normal breath sounds.  Skin:    General: Skin is warm and dry.  Neurological:     Mental Status: He is alert.      UC Treatments / Results  Labs (all labs ordered are listed, but only abnormal results are displayed) Labs Reviewed - No data to display  EKG   Radiology No results found.  Procedures Procedures (including critical care time)  Medications Ordered in UC Medications - No data to display  Initial Impression / Assessment and Plan / UC Course  I have reviewed the triage vital signs and the nursing notes.  Pertinent labs & imaging results that were available during my care of the patient were reviewed by me and considered in my medical decision making (see chart for details).  Hives  Vital signs are stable patient is in no signs of distress, O2 saturation 95% on room air and lungs are clear to auscultation, no abnormalities noted to the skin, unable to visualize puncture marks from insect bite, epinephrine refilled and discussed with patient that if he ever feels the need to administer to go to the nearest emergency department following for further evaluation and management, for localized rashes patient may continue use of oral antihistamines for management and may follow-up with urgent care as needed Final Clinical Impressions(s) / UC Diagnoses   Final diagnoses:  None   Discharge Instructions   None    ED Prescriptions   None    PDMP not reviewed this encounter.   Valinda Hoar, NP 12/21/21 1908

## 2021-12-21 NOTE — Discharge Instructions (Signed)
EpiPen has been sent to pharmacy with 1 refill  If you ever have to administer epinephrine, go to the nearest emergency department for immediate evaluation after, if in severe respiratory distress while giving epinephrine, administer and then call [phone]  If you have hives occur again you may continue use of Benadryl for management

## 2022-01-17 ENCOUNTER — Telehealth: Payer: 59 | Admitting: Physician Assistant

## 2022-01-17 DIAGNOSIS — L259 Unspecified contact dermatitis, unspecified cause: Secondary | ICD-10-CM

## 2022-01-18 MED ORDER — PREDNISONE 10 MG PO TABS: ORAL_TABLET | ORAL | 0 refills | Status: DC

## 2022-01-18 NOTE — Progress Notes (Signed)
E Visit for Rash  We are sorry that you are not feeling well. Here is how we plan to help!  Based on what you shared with me it looks like you have contact dermatitis.  Contact dermatitis is a skin rash caused by something that touches the skin and causes irritation or inflammation.  Your skin may be red, swollen, dry, cracked, and itch.  The rash should go away in a few days but can last a few weeks.  If you get a rash, it's important to figure out what caused it so the irritant can be avoided in the future.  I have prescribed a Prednisone taper to help with your symptoms. You can also take over the counter Zyrtec or Claritin for the itching   HOME CARE:  Take cool showers and avoid direct sunlight. Apply cool compress or wet dressings. Take a bath in an oatmeal bath.  Sprinkle content of one Aveeno packet under running faucet with comfortably warm water.  Bathe for 15-20 minutes, 1-2 times daily.  Pat dry with a towel. Do not rub the rash. Use hydrocortisone cream. Take an antihistamine like Benadryl for widespread rashes that itch.  The adult dose of Benadryl is 25-50 mg by mouth 4 times daily. Caution:  This type of medication may cause sleepiness.  Do not drink alcohol, drive, or operate dangerous machinery while taking antihistamines.  Do not take these medications if you have prostate enlargement.  Read package instructions thoroughly on all medications that you take.  GET HELP RIGHT AWAY IF:  Symptoms don't go away after treatment. Severe itching that persists. If you rash spreads or swells. If you rash begins to smell. If it blisters and opens or develops a yellow-brown crust. You develop a fever. You have a sore throat. You become short of breath.  MAKE SURE YOU:  Understand these instructions. Will watch your condition. Will get help right away if you are not doing well or get worse.  Thank you for choosing an e-visit.  Your e-visit answers were reviewed by a board  certified advanced clinical practitioner to complete your personal care plan. Depending upon the condition, your plan could have included both over the counter or prescription medications.  Please review your pharmacy choice. Make sure the pharmacy is open so you can pick up prescription now. If there is a problem, you may contact your provider through [employer] and have the prescription routed to another pharmacy.  Your safety is important to [place]. If you have drug allergies check your prescription carefully.   For the next 24 hours you can use MyChart to ask questions about today's visit, request a non-urgent call back, or ask for a work or school excuse. You will get an email in the next two days asking about your experience. I hope that your e-visit has been valuable and will speed your recovery.  Approximately 5 minutes was spent documenting and reviewing patient's chart.

## 2022-07-07 ENCOUNTER — Telehealth (HOSPITAL_COMMUNITY): Payer: Self-pay | Admitting: Emergency Medicine

## 2022-07-07 ENCOUNTER — Other Ambulatory Visit: Payer: Self-pay

## 2022-07-07 DIAGNOSIS — I4729 Other ventricular tachycardia: Secondary | ICD-10-CM

## 2022-07-07 NOTE — Telephone Encounter (Signed)
Reaching out to patient to offer assistance regarding upcoming cardiac imaging study; pt verbalizes understanding of appt date/time, parking situation and where to check in, pre-test NPO status and medications ordered, and verified current allergies; name and call back number provided for further questions should they arise Marchia Bond RN Navigator Cardiac Imaging [facility] [phone] office [phone] cell  Arrival 730 WC entrance Denies iv issues  Daily meds HR 59 Aware contrast/nitro

## 2022-07-08 ENCOUNTER — Ambulatory Visit (HOSPITAL_COMMUNITY)
Admission: RE | Admit: 2022-07-08 | Discharge: 2022-07-08 | Disposition: A | Discharge status: Home / Self Care | Payer: Managed Care, Other (non HMO) | Source: Ambulatory Visit | Attending: Internal Medicine | Admitting: Internal Medicine

## 2022-07-08 DIAGNOSIS — I2510 Atherosclerotic heart disease of native coronary artery without angina pectoris: Secondary | ICD-10-CM

## 2022-07-08 DIAGNOSIS — I4729 Other ventricular tachycardia: Secondary | ICD-10-CM | POA: Diagnosis present

## 2022-07-08 MED ORDER — NITROGLYCERIN 0.4 MG SL SUBL
0.8000 mg | SUBLINGUAL_TABLET | Freq: Once | SUBLINGUAL | Status: AC
  Administered 2022-07-08: 0.8 mg via SUBLINGUAL

## 2022-07-08 MED ORDER — NITROGLYCERIN 0.4 MG SL SUBL
SUBLINGUAL_TABLET | SUBLINGUAL | Status: AC
  Filled 2022-07-08: qty 2

## 2022-07-08 MED ORDER — IOHEXOL 350 MG/ML SOLN
100.0000 mL | Freq: Once | INTRAVENOUS | Status: AC | PRN
  Administered 2022-07-08: 100 mL via INTRAVENOUS

## 2022-09-07 ENCOUNTER — Encounter: Payer: Self-pay | Admitting: Neurology

## 2022-09-27 ENCOUNTER — Encounter: Payer: Self-pay | Admitting: Neurology

## 2022-09-27 ENCOUNTER — Other Ambulatory Visit (INDEPENDENT_AMBULATORY_CARE_PROVIDER_SITE_OTHER): Payer: Managed Care, Other (non HMO)

## 2022-09-27 ENCOUNTER — Ambulatory Visit: Payer: Managed Care, Other (non HMO) | Admitting: Neurology

## 2022-09-27 VITALS — BP 140/73 | HR 94 | Ht 72.0 in | Wt 176.0 lb

## 2022-09-27 DIAGNOSIS — R202 Paresthesia of skin: Secondary | ICD-10-CM

## 2022-09-27 LAB — B12 AND FOLATE PANEL
Folate: 15.2 ng/mL (ref >5.9)
Vitamin B-12: 160 pg/mL — ABNORMAL LOW (ref 211–911)

## 2022-09-27 NOTE — Progress Notes (Signed)
[facility] Clinic Note - Initial Visit   Date: 09/27/2022   Clayton Mendoza MRN: [id number] DOB: [birth year]   Dear Dr. Thompson Grayer:   Thank you for your kind referral of Clayton Mendoza for consultation of bilateral feet numbness. Although his history is well known to you, please allow [place] to reiterate it for the purpose of our medical record. The patient was accompanied to the clinic by self.    Clayton Mendoza is a 67 y.o. right-handed male with cardiac arrhyhtmia, migraine, and anxiety/depression  presenting for evaluation of imbalance.   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  Risk factors:  history of dilantin use, alcohol  - Check vitamin B12, folate, vitamin B1  Imbalance, improved.  Possibly due manifestation of anxiety.  Return to clinic as needed  ------------------------------------------------------------- History of present illness: In February/March, he was having problems with imbalance which was worse when still or walking.  He has the sensation of falls, but did not falls.  Symptoms have gradually improved.   He was having high anxiety and has been seeing a counselor, which has significantly helped his anxiety.  Interestingly, imbalance has also improved.   He had history of neuropathy in the feet since 2000.  Over the past 20 years, he has gradually extended up to the level of the knees.  He has sensation as if wearing fuzzy socks.  He also has some numbness and tingling.  He was evaluated at [facility] where EMG was performed and showed neuropathy.    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  He was started on dilantin which he took for ~15 years and remained seizure-free.  He has not had any other seizures.   He has migraines and has [facility]. He takes OTC NSAIDs and tylenol.   Out-side paper records, electronic medical record, and images have been  reviewed where available and summarized as:  Lab Results  Component Value Date   HGBA1C 5.5 08/13/2015   No results found for: "VITAMINB12" Lab Results  Component Value Date   TSH 1.86 08/13/2015   Past Medical History:  Diagnosis Date   Anemia    Arrhythmia    Depression    Heart murmur    Lumbar herniated disc    Migraine    Renal disorder    Seizures     Past Surgical History:  Procedure Laterality Date   CARDIAC CATHETERIZATION       Medications:  Outpatient Encounter Medications as of 09/27/2022  Medication Sig   Alum Hydroxide-Mag Trisilicate (GAVISCON) 80-14.2 MG CHEW Chew by mouth.   EPINEPHrine (EPIPEN 2-PAK) 0.3 mg/0.3 mL IJ SOAJ injection Inject 0.3 mg into the muscle as needed for anaphylaxis.   lovastatin (MEVACOR) 20 MG tablet 20 mg. Take one tablet Mendoza   [DISCONTINUED] HYDROcodone-acetaminophen (NORCO) 5-325 MG tablet Take 1 tablet by mouth 3 (three) times Mendoza as needed.   [DISCONTINUED] methocarbamol (ROBAXIN) 500 MG tablet Take 1 tablet (500 mg total) by mouth 2 (two) times Mendoza as needed.   [DISCONTINUED] omeprazole (PRILOSEC) 20 MG capsule TAKE 1 CAPSULE BY MOUTH EVERY DAY   [DISCONTINUED] predniSONE (DELTASONE) 10 MG tablet Take five 10 mg tablets for 3 days, then take four 10 mg tablets for 3 days, then take three 10 mg tablets for 3 days, then take two 10 mg tablets for 3 days, then take one 10 mg tablets for 3  days   [DISCONTINUED] predniSONE (STERAPRED UNI-PAK 21 TAB) 10 MG (21) TBPK tablet Take by mouth Mendoza. Take 3 tablets Mendoza with food for 7 days   No facility-administered encounter medications on file as of 09/27/2022.    Allergies:  Allergies  Allergen Reactions   Bee Venom Itching, Other (See Comments) and Swelling   Gramineae Pollens Other (See Comments)    Family History: Family History  Problem Relation Age of Onset   Seizures Mother    Atrial fibrillation Mother    Heart disease Mother    Cancer Father        prostate    Dementia Father    Heart disease Father        Quadruple bypass    Social History: Social History   Tobacco Use   Smoking status: Never   Smokeless tobacco: Never  Vaping Use   Vaping Use: Never used  Substance Use Topics   Alcohol use: Yes    Alcohol/week: 5.0 standard drinks of alcohol    Types: 5 Standard drinks or equivalent per week    Comment: wine with dinner   Drug use: No   Social History   Social History Narrative   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  Speech is not dysarthric.  CRANIAL NERVES: II:  No visual field defects.     III-IV-VI: Pupils equal round and reactive to light.  Normal conjugate, extra-ocular eye movements in all directions of gaze.  No nystagmus.  No ptosis.   V:  Normal facial sensation.    VII:  Normal facial symmetry and movements.   VIII:  Normal hearing and vestibular function.   IX-X:  Normal palatal movement.   XI:  Normal shoulder shrug and head rotation.   XII:  Normal tongue strength and range of motion, no deviation or fasciculation.  MOTOR:  No atrophy, fasciculations or abnormal movements.  No pronator drift.   Upper Extremity:  Right  Left  Deltoid  5/5   5/5   Biceps  5/5   5/5   Triceps  5/5   5/5   Wrist extensors  5/5   5/5   Wrist flexors  5/5   5/5   Finger extensors  5/5   5/5   Finger flexors  5/5   5/5   Dorsal interossei  5/5   5/5   Abductor pollicis  5/5   5/5   Tone (Ashworth scale)  0  0   Lower Extremity:  Right  Left  Hip flexors  5/5   5/5   Knee flexors  5/5   5/5   Knee extensors  5/5   5/5   Dorsiflexors  5/5    5/5   Plantarflexors  5/5   5/5   Toe extensors  5/5   5/5   Toe flexors  5/5   5/5   Tone (Ashworth scale)  0  0   MSRs:  Right        Left brachioradialis 2+  2+  biceps 2+  2+  triceps 2+  2+  patellar 2+  2+  ankle jerk 0  0  Hoffman no  no  plantar response down  down   SENSORY:  Reduced vibration at the left >> right foot.  Temperature and pin prick intact throughout.  Romberg's sign absent.   COORDINATION/GAIT: Normal finger-to- nose-finger.  Intact rapid alternating movements bilaterally.  Gait narrow based and stable. Tandem and stressed gait intact.    Thank you for allowing me to participate in patient's care.  If I can answer any additional questions, I would be pleased to do so.    Sincerely,    Durinda Buzzelli K. Allena Katz, DO

## 2022-09-27 NOTE — Patient Instructions (Addendum)
Check labs  Start home balance exercises  Check your feet daily

## 2022-09-30 LAB — VITAMIN B1: Vitamin B1 (Thiamine): 15 nmol/L (ref 8–30)

## 2022-10-07 ENCOUNTER — Telehealth: Payer: Self-pay | Admitting: Neurology

## 2022-10-07 NOTE — Telephone Encounter (Signed)
Pt called in returning Mahina's call 

## 2022-10-07 NOTE — Telephone Encounter (Signed)
New message    Returning call back to the nurse.  

## 2022-10-07 NOTE — Telephone Encounter (Signed)
Called patient and left a message for a call back. Need to see if he would like to proceed with b12 injections.

## 2022-10-10 NOTE — Telephone Encounter (Signed)
Called patient and left a detailed message per DPR to see if he wanted to proceed with B12 injections. Asked patient to leave a message with the front if he is unable to get a hold of me (we keep missing one another's calls) or send me a MyChart message. I did let patient know that if he had any other concerns or questions to let the front know and I will call him back.
